# Patient Record
Sex: Female | Born: 1955
Health system: Southern US, Community
[De-identification: ages and names within clinical notes are randomized; demographics above are authoritative.]

## PROBLEM LIST (undated history)

## (undated) DIAGNOSIS — I10 Essential (primary) hypertension: Secondary | ICD-10-CM

## (undated) DIAGNOSIS — R112 Nausea with vomiting, unspecified: Secondary | ICD-10-CM

## (undated) DIAGNOSIS — K5792 Diverticulitis of intestine, part unspecified, without perforation or abscess without bleeding: Secondary | ICD-10-CM

## (undated) DIAGNOSIS — G4733 Obstructive sleep apnea (adult) (pediatric): Secondary | ICD-10-CM

## (undated) HISTORY — DX: Essential (primary) hypertension: I10

## (undated) HISTORY — DX: Diverticulitis of intestine, part unspecified, without perforation or abscess without bleeding: K57.92

## (undated) HISTORY — PX: ABDOMINAL HYSTERECTOMY: SHX81

## (undated) HISTORY — DX: Obstructive sleep apnea (adult) (pediatric): G47.33

## (undated) HISTORY — PX: SMALL INTESTINE SURGERY: SHX150

---

## 2004-04-24 ENCOUNTER — Ambulatory Visit: Payer: Self-pay | Admitting: Cardiology

## 2005-05-13 HISTORY — PX: COLONOSCOPY: SHX174

## 2005-12-06 ENCOUNTER — Ambulatory Visit: Payer: Self-pay | Admitting: Gastroenterology

## 2006-03-24 ENCOUNTER — Ambulatory Visit (HOSPITAL_COMMUNITY): Admission: RE | Admit: 2006-03-24 | Discharge: 2006-03-24 | Payer: Self-pay | Admitting: Gastroenterology

## 2006-03-24 ENCOUNTER — Ambulatory Visit: Payer: Self-pay | Admitting: Gastroenterology

## 2007-01-30 ENCOUNTER — Ambulatory Visit (HOSPITAL_COMMUNITY): Admission: RE | Admit: 2007-01-30 | Discharge: 2007-01-30 | Payer: Self-pay | Admitting: Internal Medicine

## 2007-10-02 ENCOUNTER — Ambulatory Visit (HOSPITAL_COMMUNITY): Admission: RE | Admit: 2007-10-02 | Discharge: 2007-10-02 | Payer: Self-pay | Admitting: *Deleted

## 2008-02-22 ENCOUNTER — Ambulatory Visit (HOSPITAL_COMMUNITY): Admission: RE | Admit: 2008-02-22 | Discharge: 2008-02-22 | Payer: Self-pay | Admitting: Family Medicine

## 2008-06-02 ENCOUNTER — Ambulatory Visit (HOSPITAL_COMMUNITY): Admission: RE | Admit: 2008-06-02 | Discharge: 2008-06-02 | Payer: Self-pay | Admitting: Family Medicine

## 2010-02-26 ENCOUNTER — Telehealth (INDEPENDENT_AMBULATORY_CARE_PROVIDER_SITE_OTHER): Payer: Self-pay

## 2010-05-28 ENCOUNTER — Ambulatory Visit (HOSPITAL_COMMUNITY)
Admission: RE | Admit: 2010-05-28 | Discharge: 2010-05-28 | Payer: Self-pay | Source: Home / Self Care | Attending: Internal Medicine | Admitting: Internal Medicine

## 2010-06-03 ENCOUNTER — Encounter: Payer: Self-pay | Admitting: Family Medicine

## 2010-06-03 ENCOUNTER — Encounter: Payer: Self-pay | Admitting: Internal Medicine

## 2010-06-12 NOTE — Progress Notes (Signed)
----   Converted from flag ---- ---- 02/26/2010 2:08 PM, West Bali MD wrote: Please call pt. I reviewed her CT report. The foreign bodies appear to be two staples. They are in her stool and should pass w/o any problems. However if she wants to be sure they are gone, then she needs a FLEX SIG not a colonoscopy. She can be done on THUR not FRI.  ---- 02/26/2010 11:33 AM, Cloria Spring LPN wrote: T/C from Melvern Sample (28-Jun-2055) requesting a colonoscopy this Friday. Her last one was 03/24/2006.  She said she had a resent CT at [facility] and it indicated she had metallic staples in the colon. I got the report  for you and it said it may represent inbsted foreign body, less likely post surgical. I am faxing CT to you at ENDO. She can be reached at work today at [phone]. ( home number [phone]) ------------------------------  Appended Document:  Informed pt of the above. She said she will see if they pass. Said she could not possibly be off on  Friday after being out last week. She will call if any problems.

## 2010-09-28 NOTE — Consult Note (Signed)
NAMETEOLA, FELIPE               ACCOUNT NO.:  [id number]   MEDICAL RECORD NO.:  [id number]          PATIENT TYPE:  AMB   LOCATION:                                FACILITY:  [facility]   PHYSICIAN:  Kassie Mends, M.D.      DATE OF BIRTH:  [birth year]   DATE OF CONSULTATION:  DATE OF DISCHARGE:                                   CONSULTATION   Dr. Doreen Beam  [address]  [place]  [place] [address]   Dear Dr. Sherril Croon:   I am seeing Lindsey Mitchell as a new patient consultation per your request.  I am  seeing her for constipation.   Lindsey Mitchell is a 55 year old female whose normal bowel pattern was 1-2 bowel  movements a week.  Approximately 4-5 years, she required dieter's tea to  have a bowel movement on a daily basis.  She has tried going without the  dieter's tea and will become miserable.  She denies difficulty pushing the  stools out.  She does not have the urge to defecate.  She denies any blood  in her stool, black stools, abdominal pain, weight loss, or dysphagia.  She  has heartburn and indigestion two times a month if the constipation gets  bad.  She states that she had labs drawn two months ago but does not know  that her thyroid has been checked.  She has had an episiotomy with her  child.  She denies any over-the-counter supplements or medication.  She has  tried fruit, water, milk of magnesia without improvement in her ability to  have a bowel movement.  Her appetite is good.   Her past medical history includes hypertension, gastroesophageal reflux  disease, dysfunctional uterine bleeding and an intestinal obstruction.  Her  past surgical history includes a hysterectomy 20 years ago, lysis of  adhesions, and resection of a piece of bowel, according to her,  approximately six years ago.  She is not allergic to any medicines.  She  takes Avapro and Premarin.She denies any family history of colon cancer or  colon polyps.  She denies any family history of thyroid disease.  She denies  tobacco or alcohol use.  She works as an Print production planner for a  [employer].  She has one child, age 57. Her review of systems reveals  colonoscopy years ago for an unknown reason, otherwise all systems are  negative.   PHYSICAL EXAMINATION:  VITAL SIGNS:  Weight 163.5 pounds, height 5 foot 4  inches.  Temperature 98.1, blood pressure 134/80, pulse 78.  GENERAL:  She is in no apparent distress.  Alert and oriented x4.  HEENT:  Normocephalic and atraumatic.  Pupils are equal, round and reactive to  light.  Mouth:  No oral lesions.  Posterior pharynx without erythema or  exudate.  NECK:  Full range of motion and no lymphadenopathy.  LUNGS:  Clear  to auscultation bilaterally.  CARDIOVASCULAR:  Regular rhythm.  No murmur.  Normal S1 and S2.  ABDOMEN:  Bowel sounds are present.  Soft, nontender,  nondistended.  No rebound or guarding.  No hepatosplenomegaly.  Slightly  obese.  EXTREMITIES:  Without clubbing, cyanosis or edema.  She has no focal  neurological deficits.   Lindsey Mitchell is a 55 year old female who has longstanding history of delayed  colonic transit.  Her current difficulty having a bowel movement is likely  related to a functional bowel disorder.  The differential diagnosis includes  hypothyroidism.  Thank you for allowing me to see Lindsey Mitchell in  consultation.  My recommendations follow.   I recommend obtaining a TSH today.  She will be scheduled for a colonoscopy  to evaluate her constipation as well as for average-risk colon cancer  screening.  She may follow up in three months.   Please feel free to contact me at [phone] with additional questions.      Kassie Mends, M.D.  Electronically Signed     SM/MEDQ  D:  12/06/2005  T:  12/06/2005  Job:  [id number]   cc:   Doreen Beam  Fax: [phone]

## 2010-09-28 NOTE — Op Note (Signed)
Lindsey Mitchell, Lindsey Mitchell               ACCOUNT NO.:  [id number]   MEDICAL RECORD NO.:  [id number]          PATIENT TYPE:  AMB   LOCATION:  DAY                           FACILITY:  [facility]   PHYSICIAN:  Kassie Mends, M.D.      DATE OF BIRTH:  1956/02/03   DATE OF PROCEDURE:  DATE OF DISCHARGE:                                 OPERATIVE REPORT   PROCEDURE PERFORMED:  Colonoscopy   INDICATIONS FOR PROCEDURE:  Ms. Brent is a 55 year old female who presents  for average risk colon cancer screening.   FINDINGS:  1. Rare right-sided diverticulosis.  2. Melanosis coli.  3. Otherwise normal colon without evidence of polyps, masses, inflammatory      changes or vascular ectasias.   RECOMMENDATIONS:  High fiber diet.  Hand-out given on high fiber diet and  diverticulosis.  Screening colonoscopy in ten years.   FOLLOWUP:  With Dr. Sherril Croon.   MEDICATIONS:  1. Demerol 100 mg IV.  2. Versed 6 mg IV.   PROCEDURE TECHNIQUE:  Physical exam was performed and informed consent  obtained from the patient after explaining the benefits, risks and  alternatives to the procedure.  The patient was connected to the monitor and  placed in the left lateral position.  Continuous oxygen was provided by  nasal cannula and IV meds administered via indwelling cannula.  After  administration of sedation and rectal exam the patient's rectum was  intubated and the  scope was advanced under direct visualization to the cecum.  The scope was  subsequently removed slowly, by carefully examing the color, texture,  anatomy and integrity of the mucosa on the way out. The patient was  recovered in the endoscopy suite and discharged home in satisfactory  condition.      Kassie Mends, M.D.  Electronically Signed     SM/MEDQ  D:  03/24/2006  T:  03/24/2006  Job:  [id number]   cc:   Doreen Beam  Fax: [phone]

## 2011-06-13 ENCOUNTER — Other Ambulatory Visit (HOSPITAL_COMMUNITY): Payer: Self-pay | Admitting: Internal Medicine

## 2011-06-13 DIAGNOSIS — Z139 Encounter for screening, unspecified: Secondary | ICD-10-CM

## 2011-06-14 ENCOUNTER — Ambulatory Visit (HOSPITAL_COMMUNITY)
Admission: RE | Admit: 2011-06-14 | Discharge: 2011-06-14 | Disposition: A | Discharge status: Home / Self Care | Payer: 59 | Source: Ambulatory Visit | Attending: Internal Medicine | Admitting: Internal Medicine

## 2011-06-14 DIAGNOSIS — Z139 Encounter for screening, unspecified: Secondary | ICD-10-CM

## 2011-06-14 DIAGNOSIS — Z1231 Encounter for screening mammogram for malignant neoplasm of breast: Secondary | ICD-10-CM | POA: Insufficient documentation

## 2013-03-11 ENCOUNTER — Other Ambulatory Visit (HOSPITAL_COMMUNITY): Payer: Self-pay | Admitting: Internal Medicine

## 2013-03-11 DIAGNOSIS — Z139 Encounter for screening, unspecified: Secondary | ICD-10-CM

## 2013-03-15 ENCOUNTER — Ambulatory Visit (HOSPITAL_COMMUNITY)
Admission: RE | Admit: 2013-03-15 | Discharge: 2013-03-15 | Disposition: A | Discharge status: Home / Self Care | Payer: 59 | Source: Ambulatory Visit | Attending: Internal Medicine | Admitting: Internal Medicine

## 2013-03-15 DIAGNOSIS — Z1231 Encounter for screening mammogram for malignant neoplasm of breast: Secondary | ICD-10-CM | POA: Insufficient documentation

## 2013-03-15 DIAGNOSIS — Z139 Encounter for screening, unspecified: Secondary | ICD-10-CM

## 2014-04-12 HISTORY — PX: COLONOSCOPY: SHX174

## 2014-05-10 ENCOUNTER — Other Ambulatory Visit (HOSPITAL_COMMUNITY): Payer: Self-pay | Admitting: Internal Medicine

## 2014-05-10 DIAGNOSIS — Z1231 Encounter for screening mammogram for malignant neoplasm of breast: Secondary | ICD-10-CM

## 2014-05-12 ENCOUNTER — Ambulatory Visit (HOSPITAL_COMMUNITY)
Admission: RE | Admit: 2014-05-12 | Discharge: 2014-05-12 | Disposition: A | Discharge status: Home / Self Care | Payer: 59 | Source: Ambulatory Visit | Attending: Internal Medicine | Admitting: Internal Medicine

## 2014-05-12 DIAGNOSIS — Z1231 Encounter for screening mammogram for malignant neoplasm of breast: Secondary | ICD-10-CM | POA: Insufficient documentation

## 2016-02-08 ENCOUNTER — Emergency Department (HOSPITAL_COMMUNITY)
Admission: EM | Admit: 2016-02-08 | Discharge: 2016-02-08 | Disposition: A | Discharge status: Home / Self Care | Payer: 59 | Attending: Emergency Medicine | Admitting: Emergency Medicine

## 2016-02-08 ENCOUNTER — Emergency Department (HOSPITAL_COMMUNITY): Payer: 59

## 2016-02-08 ENCOUNTER — Encounter (HOSPITAL_COMMUNITY): Payer: Self-pay | Admitting: Emergency Medicine

## 2016-02-08 DIAGNOSIS — Z79899 Other long term (current) drug therapy: Secondary | ICD-10-CM | POA: Insufficient documentation

## 2016-02-08 DIAGNOSIS — R1012 Left upper quadrant pain: Secondary | ICD-10-CM | POA: Diagnosis present

## 2016-02-08 DIAGNOSIS — R112 Nausea with vomiting, unspecified: Secondary | ICD-10-CM | POA: Diagnosis not present

## 2016-02-08 DIAGNOSIS — R1084 Generalized abdominal pain: Secondary | ICD-10-CM | POA: Insufficient documentation

## 2016-02-08 DIAGNOSIS — R109 Unspecified abdominal pain: Secondary | ICD-10-CM

## 2016-02-08 LAB — LIPASE, BLOOD: Lipase: 16 U/L (ref 11–51)

## 2016-02-08 LAB — COMPREHENSIVE METABOLIC PANEL
ALT: 43 U/L (ref 14–54)
ANION GAP: 10 (ref 5–15)
AST: 28 U/L (ref 15–41)
Albumin: 4.8 g/dL (ref 3.5–5.0)
Alkaline Phosphatase: 81 U/L (ref 38–126)
BUN: 19 mg/dL (ref 6–20)
CHLORIDE: 100 mmol/L — AB (ref 101–111)
CO2: 26 mmol/L (ref 22–32)
Calcium: 9.7 mg/dL (ref 8.9–10.3)
Creatinine, Ser: 0.73 mg/dL (ref 0.44–1.00)
GFR calc non Af Amer: >60 mL/min (ref >60)
Glucose, Bld: 117 mg/dL — ABNORMAL HIGH (ref 65–99)
POTASSIUM: 3.4 mmol/L — AB (ref 3.5–5.1)
SODIUM: 136 mmol/L (ref 135–145)
Total Bilirubin: 0.8 mg/dL (ref 0.3–1.2)
Total Protein: 7.3 g/dL (ref 6.5–8.1)

## 2016-02-08 LAB — CBC WITH DIFFERENTIAL/PLATELET
BASOS ABS: 0 10*3/uL (ref 0.0–0.1)
BASOS PCT: 0 %
Eosinophils Absolute: 0 10*3/uL (ref 0.0–0.7)
Eosinophils Relative: 0 %
HEMATOCRIT: 41.9 % (ref 36.0–46.0)
HEMOGLOBIN: 14.3 g/dL (ref 12.0–15.0)
Lymphocytes Relative: 12 %
Lymphs Abs: 1.1 10*3/uL (ref 0.7–4.0)
MCH: 31.1 pg (ref 26.0–34.0)
MCHC: 34.1 g/dL (ref 30.0–36.0)
MCV: 91.1 fL (ref 78.0–100.0)
Monocytes Absolute: 0.6 10*3/uL (ref 0.1–1.0)
Monocytes Relative: 7 %
NEUTROS ABS: 7.6 10*3/uL (ref 1.7–7.7)
NEUTROS PCT: 81 %
Platelets: 291 10*3/uL (ref 150–400)
RBC: 4.6 MIL/uL (ref 3.87–5.11)
RDW: 12.3 % (ref 11.5–15.5)
WBC: 9.3 10*3/uL (ref 4.0–10.5)

## 2016-02-08 LAB — URINALYSIS, ROUTINE W REFLEX MICROSCOPIC
Bilirubin Urine: NEGATIVE
Glucose, UA: NEGATIVE mg/dL
Hgb urine dipstick: NEGATIVE
Ketones, ur: NEGATIVE mg/dL
Leukocytes, UA: NEGATIVE
NITRITE: NEGATIVE
Protein, ur: NEGATIVE mg/dL
SPECIFIC GRAVITY, URINE: 1.01 (ref 1.005–1.030)
pH: 6.5 (ref 5.0–8.0)

## 2016-02-08 MED ORDER — IOPAMIDOL (ISOVUE-300) INJECTION 61%
INTRAVENOUS | Status: AC
  Administered 2016-02-08: 100 mL
  Filled 2016-02-08: qty 30

## 2016-02-08 MED ORDER — SODIUM CHLORIDE 0.9 % IV SOLN
Freq: Once | INTRAVENOUS | Status: AC
  Administered 2016-02-08: 18:00:00 via INTRAVENOUS

## 2016-02-08 MED ORDER — ONDANSETRON HCL 4 MG/2ML IJ SOLN
4.0000 mg | Freq: Once | INTRAMUSCULAR | Status: AC
  Administered 2016-02-08: 4 mg via INTRAVENOUS
  Filled 2016-02-08: qty 2

## 2016-02-08 MED ORDER — FENTANYL CITRATE (PF) 100 MCG/2ML IJ SOLN
50.0000 ug | Freq: Once | INTRAMUSCULAR | Status: AC
  Administered 2016-02-08: 50 ug via INTRAVENOUS
  Filled 2016-02-08: qty 2

## 2016-02-08 MED ORDER — MORPHINE SULFATE (PF) 4 MG/ML IV SOLN
2.0000 mg | Freq: Once | INTRAVENOUS | Status: AC
  Administered 2016-02-08: 2 mg via INTRAVENOUS

## 2016-02-08 MED ORDER — IOPAMIDOL (ISOVUE-300) INJECTION 61%: 100.0000 mL | Freq: Once | INTRAVENOUS | Status: DC | PRN

## 2016-02-08 MED ORDER — MORPHINE SULFATE (PF) 4 MG/ML IV SOLN
4.0000 mg | Freq: Once | INTRAVENOUS | Status: DC
  Filled 2016-02-08: qty 1

## 2016-02-08 MED ORDER — METRONIDAZOLE 500 MG PO TABS: 500.0000 mg | ORAL_TABLET | Freq: Two times a day (BID) | ORAL | 0 refills | Status: DC

## 2016-02-08 MED ORDER — CIPROFLOXACIN HCL 500 MG PO TABS: 500.0000 mg | ORAL_TABLET | Freq: Two times a day (BID) | ORAL | 0 refills | Status: DC

## 2016-02-08 MED ORDER — ONDANSETRON HCL 4 MG PO TABS: 4.0000 mg | ORAL_TABLET | Freq: Four times a day (QID) | ORAL | 0 refills | Status: DC

## 2016-02-08 NOTE — ED Provider Notes (Signed)
[facility] DEPT Provider Note   CSN: [id number] Arrival date & time: 02/08/16  1628     History   Chief Complaint Chief Complaint  Patient presents with  . Abdominal Pain    HPI Lindsey Mitchell is a 60 y.o. female.  Patient presents with diffuse abdominal pain with nausea and vomiting. States she woke around 2 AM with left upper quadrant abdominal pain and has since moved it radiated across her entire abdomen. One episode of vomiting at 5 AM 1 episode of vomiting at noon. States she's had a surgery in the past for a stomach obstruction". Also has had a hysterectomy. Denies any fevers. Denies any chest pain or shortness of breath. Denies any urinary or vaginal symptoms. Is still passing gas and had a normal bowel movement today.   The history is provided by the patient.  Abdominal Pain   Associated symptoms include nausea and vomiting. Pertinent negatives include fever, dysuria, hematuria, headaches, arthralgias and myalgias.    History reviewed. No pertinent past medical history.  There are no active problems to display for this patient.   Past Surgical History:  Procedure Laterality Date  . ABDOMINAL HYSTERECTOMY      OB History    Gravida Para Term Preterm AB Living   1 1 1          SAB TAB Ectopic Multiple Live Births                   Home Medications    Prior to Admission medications   Not on File    Family History Family History  Problem Relation Age of Onset  . Heart failure Mother   . Heart failure Father   . Hypertension Father   . Heart attack Sister   . Heart attack Brother   . Stroke Brother   . Hypertension Other     Social History Social History  Substance Use Topics  . Smoking status: Never Smoker  . Smokeless tobacco: Never Used  . Alcohol use Yes     Comment: socially     Allergies   Review of patient's allergies indicates no known allergies.   Review of Systems Review of Systems  Constitutional: Positive for activity  change and appetite change. Negative for fever.  HENT: Negative for congestion.   Respiratory: Negative for cough, chest tightness and shortness of breath.   Cardiovascular: Negative for chest pain.  Gastrointestinal: Positive for abdominal pain, nausea and vomiting.  Genitourinary: Negative for dysuria, hematuria, urgency, vaginal bleeding and vaginal discharge.  Musculoskeletal: Negative for arthralgias and myalgias.  Neurological: Negative for dizziness, light-headedness and headaches.     Physical Exam Updated Vital Signs BP 183/93   Pulse 90   Temp 98.1 F (36.7 C) (Oral)   Resp 18   Ht 5\' 4"  (1.626 m)   Wt 196 lb (88.9 kg)   SpO2 98%   BMI 33.64 kg/m   Physical Exam  Constitutional: She is oriented to person, place, and time. She appears well-developed and well-nourished. No distress.  HENT:  Head: Normocephalic and atraumatic.  Mouth/Throat: Oropharynx is clear and moist. No oropharyngeal exudate.  Eyes: Conjunctivae and EOM are normal. Pupils are equal, round, and reactive to light.  Neck: Normal range of motion. Neck supple.  No meningismus.  Cardiovascular: Normal rate, regular rhythm, normal heart sounds and intact distal pulses.   No murmur heard. Pulmonary/Chest: Effort normal and breath sounds normal. No respiratory distress.  Abdominal: Soft. There is tenderness. There is  no rebound and no guarding.  Diffuse abdominal tenderness, no guarding or rebound  Musculoskeletal: Normal range of motion. She exhibits no edema or tenderness.  No CVA tenderness  Neurological: She is alert and oriented to person, place, and time. No cranial nerve deficit. She exhibits normal muscle tone. Coordination normal.  No ataxia on finger to nose bilaterally. No pronator drift. 5/5 strength throughout. CN 2-12 intact.Equal grip strength. Sensation intact.   Skin: Skin is warm.  Psychiatric: She has a normal mood and affect. Her behavior is normal.  Nursing note and vitals  reviewed.    ED Treatments / Results  Labs (all labs ordered are listed, but only abnormal results are displayed) Labs Reviewed  COMPREHENSIVE METABOLIC PANEL - Abnormal; Notable for the following:       Result Value   Potassium 3.4 (*)    Chloride 100 (*)    Glucose, Bld 117 (*)    All other components within normal limits  CBC WITH DIFFERENTIAL/PLATELET  LIPASE, BLOOD  URINALYSIS, ROUTINE W REFLEX MICROSCOPIC (NOT AT [facility])    EKG  EKG Interpretation None       Radiology Ct Abdomen Pelvis W Contrast  Result Date: 02/08/2016 CLINICAL DATA:  Acute onset of generalized abdominal pain, worsening right. Vomiting. Initial encounter. 46 the PICC more EXAM: CT ABDOMEN AND PELVIS WITH CONTRAST TECHNIQUE: Multidetector CT imaging of the abdomen and pelvis was performed using the standard protocol following bolus administration of intravenous contrast. CONTRAST:  164mL ISOVUE-300 IOPAMIDOL (ISOVUE-300) INJECTION 61% COMPARISON:  None. FINDINGS: Lower chest: The visualized lung bases are grossly clear. The visualized portions of the mediastinum are unremarkable. Hepatobiliary: There is diffuse fatty infiltration within the liver. The gallbladder is grossly unremarkable in appearance. The common bile duct remains normal in caliber. Pancreas: The pancreas is within normal limits. Spleen: The spleen is unremarkable in appearance. Adrenals/Urinary Tract: The adrenal glands are unremarkable in appearance. The kidneys are within normal limits. There is no evidence of hydronephrosis. No renal or ureteral stones are identified. No perinephric stranding is seen. Stomach/Bowel: The stomach is unremarkable in appearance. There appears to be trace fluid and mild inflammation about ileal loops at the right lower quadrant, tracking minimally adjacent to the cecum, which may reflect a mild infectious or inflammatory ileitis. The appendix is not visualized; there is no evidence for appendicitis. The colon is  unremarkable in appearance. Vascular/Lymphatic: Scattered calcification is seen along the abdominal aorta and its branches. The abdominal aorta is otherwise grossly unremarkable. The inferior vena cava is grossly unremarkable. No retroperitoneal lymphadenopathy is seen. No pelvic sidewall lymphadenopathy is identified. Reproductive: The bladder is mildly distended and grossly unremarkable. The patient is status post hysterectomy. No suspicious adnexal masses are seen. Other: No additional soft tissue abnormalities are seen. Musculoskeletal: No significant soft tissue abnormalities are seen. IMPRESSION: 1. Trace fluid and mild inflammation about ileal loops at the right lower quadrant, tracking minimally adjacent to the cecum, which may reflect a mild infectious or inflammatory ileitis. 2. Diffuse fatty infiltration within the liver. 3. Scattered aortic atherosclerosis noted. Electronically Signed   By: Garald Balding M.D.   On: 02/08/2016 21:18   Dg Abdomen Acute W/chest  Result Date: 02/08/2016 CLINICAL DATA:  Mid to lower abdominal pain since this morning intermittently radiating to the lower back. History of small bowel obstruction or surgical repair and hysterectomy. EXAM: ACUTE ABDOMEN SERIES (2 VIEW ABDOMEN AND 1 VIEW CHEST) COMPARISON:  Chest radiograph comparison to 10/17/2015 and abdomen radiographs to 02/24/2011 FINDINGS:  The heart and mediastinal contours are normal. The lungs are clear. Interlocking chain artifact noted at along the right side of the neck. No acute osseous abnormality. In the right hemiabdomen is a moderately dilated small bowel loop out of proportion to nondistended, nonobstructed appearing large bowel. Findings may reflect early or partial SBO. Moderate amount of fecal retention in the ascending colon and cecum. No free air. No organomegaly. No pathologic calcifications. IMPRESSION: Abnormal dilated small bowel loop in the right hemi abdomen suspicious for early or partial SBO.  These results were called by telephone at the time of interpretation on 02/08/2016 at 6:14 pm to Dr. Ezequiel Essex , who verbally acknowledged these results. Electronically Signed   By: Ashley Royalty M.D.   On: 02/08/2016 18:15    Procedures Procedures (including critical care time)  Medications Ordered in ED Medications  ondansetron (ZOFRAN) injection 4 mg (not administered)  morphine 4 MG/ML injection 4 mg (not administered)  0.9 %  sodium chloride infusion (not administered)     Initial Impression / Assessment and Plan / ED Course  I have reviewed the triage vital signs and the nursing notes.  Pertinent labs & imaging results that were available during my care of the patient were reviewed by me and considered in my medical decision making (see chart for details).  Clinical Course  Diffuse abdominal pain with nausea and vomiting. Concern for possible bowel obstruction. We'll obtain labs and x-ray.  AAS as above. Labs and UA reassuring.  CT scan reviewed with Dr. Radene Knee. There is no evidence of bowel obstruction. There is some ileitis.  Will treat with antibiotics.  Patient tolerating PO in the ED and pain has improved.  Followup with PCP, return precautions discussed.  Final Clinical Impressions(s) / ED Diagnoses   Final diagnoses:  Abdominal pain, unspecified abdominal location    New Prescriptions New Prescriptions   No medications on file     Ezequiel Essex, MD 02/09/16 0150

## 2016-02-08 NOTE — ED Triage Notes (Signed)
Pt reports abdominal pain that began this am and has become much worse. Pt states the pain started in the L mid abdomen but now radiates all the way across. Emesis x 2. Pt has prior hx of bowel obstruction.

## 2016-02-08 NOTE — Discharge Instructions (Signed)
Take the antibiotics as prescribed and followup with your doctor. Return to the ED if you develop new or worsening symptoms. °

## 2016-02-08 NOTE — ED Notes (Signed)
Patient returned from CT at this time. States pain is subsiding with the help of pain medication.

## 2016-02-08 NOTE — ED Notes (Signed)
Pt given ginger ale.

## 2016-02-21 ENCOUNTER — Telehealth: Payer: Self-pay | Admitting: Gastroenterology

## 2016-02-21 NOTE — Telephone Encounter (Signed)
RECALL FOR TCS °

## 2016-02-22 NOTE — Telephone Encounter (Signed)
Letter mailed

## 2016-07-01 ENCOUNTER — Other Ambulatory Visit (HOSPITAL_COMMUNITY): Payer: Self-pay | Admitting: Internal Medicine

## 2016-07-01 DIAGNOSIS — Z1231 Encounter for screening mammogram for malignant neoplasm of breast: Secondary | ICD-10-CM

## 2016-07-08 ENCOUNTER — Ambulatory Visit (HOSPITAL_COMMUNITY)
Admission: RE | Admit: 2016-07-08 | Discharge: 2016-07-08 | Disposition: A | Discharge status: Home / Self Care | Payer: 59 | Source: Ambulatory Visit | Attending: Internal Medicine | Admitting: Internal Medicine

## 2016-07-08 DIAGNOSIS — Z1231 Encounter for screening mammogram for malignant neoplasm of breast: Secondary | ICD-10-CM | POA: Diagnosis not present

## 2017-01-03 ENCOUNTER — Inpatient Hospital Stay (HOSPITAL_COMMUNITY): Admission: RE | Admit: 2017-01-03 | Payer: 59 | Source: Ambulatory Visit

## 2017-01-10 ENCOUNTER — Encounter (HOSPITAL_COMMUNITY): Admission: RE | Payer: Self-pay | Source: Ambulatory Visit

## 2017-01-10 ENCOUNTER — Ambulatory Visit (HOSPITAL_COMMUNITY): Admission: RE | Admit: 2017-01-10 | Payer: 59 | Source: Ambulatory Visit | Admitting: Ophthalmology

## 2017-01-10 SURGERY — PHACOEMULSIFICATION, CATARACT, WITH IOL INSERTION
Anesthesia: Monitor Anesthesia Care | Site: Eye | Laterality: Left

## 2017-09-23 IMAGING — DX DG ABDOMEN ACUTE W/ 1V CHEST
4 series · 4 of 4 positions shown · non-contrast
Comparison: Chest radiograph comparison to 10/17/2015 and abdomen
radiographs to 02/24/2011

CLINICAL DATA: Mid to lower abdominal pain since this morning
intermittently radiating to the lower back. History of small bowel
obstruction or surgical repair and hysterectomy.

EXAM:
ACUTE ABDOMEN SERIES (2 VIEW ABDOMEN AND 1 VIEW CHEST)

[chest pa]
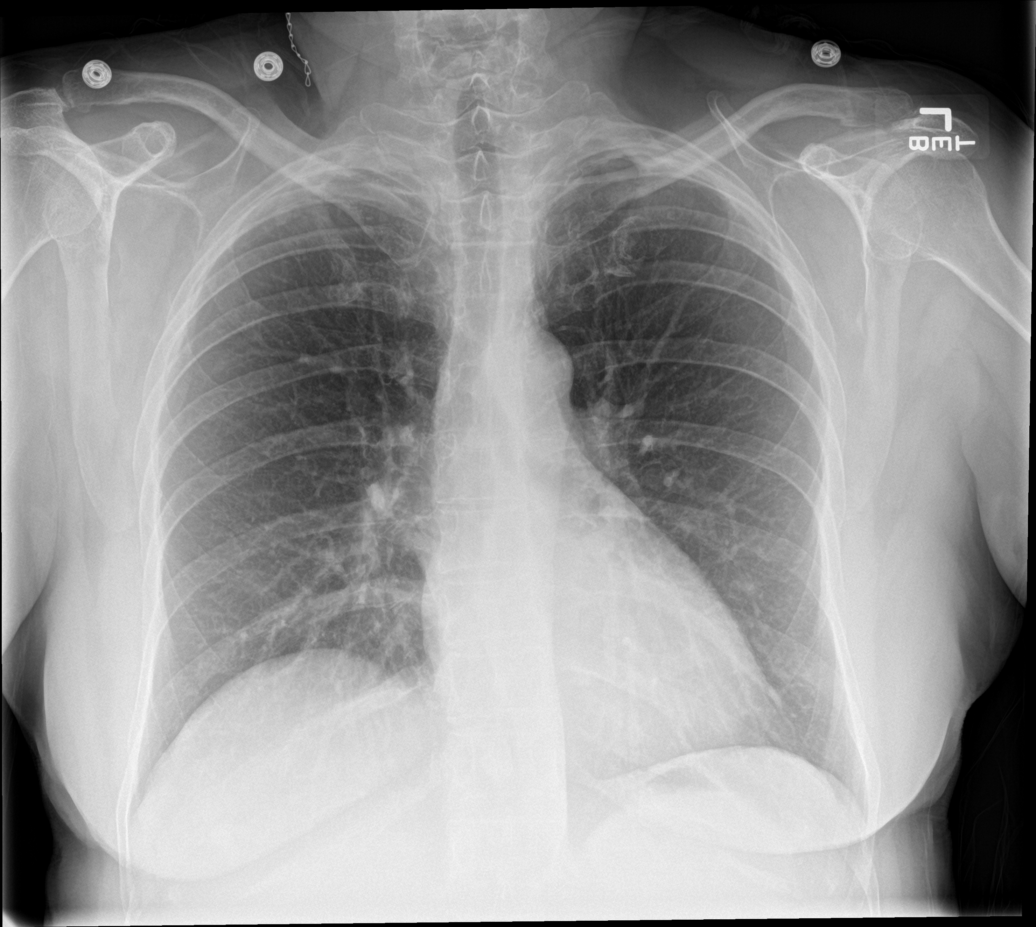

[abdomen erect]
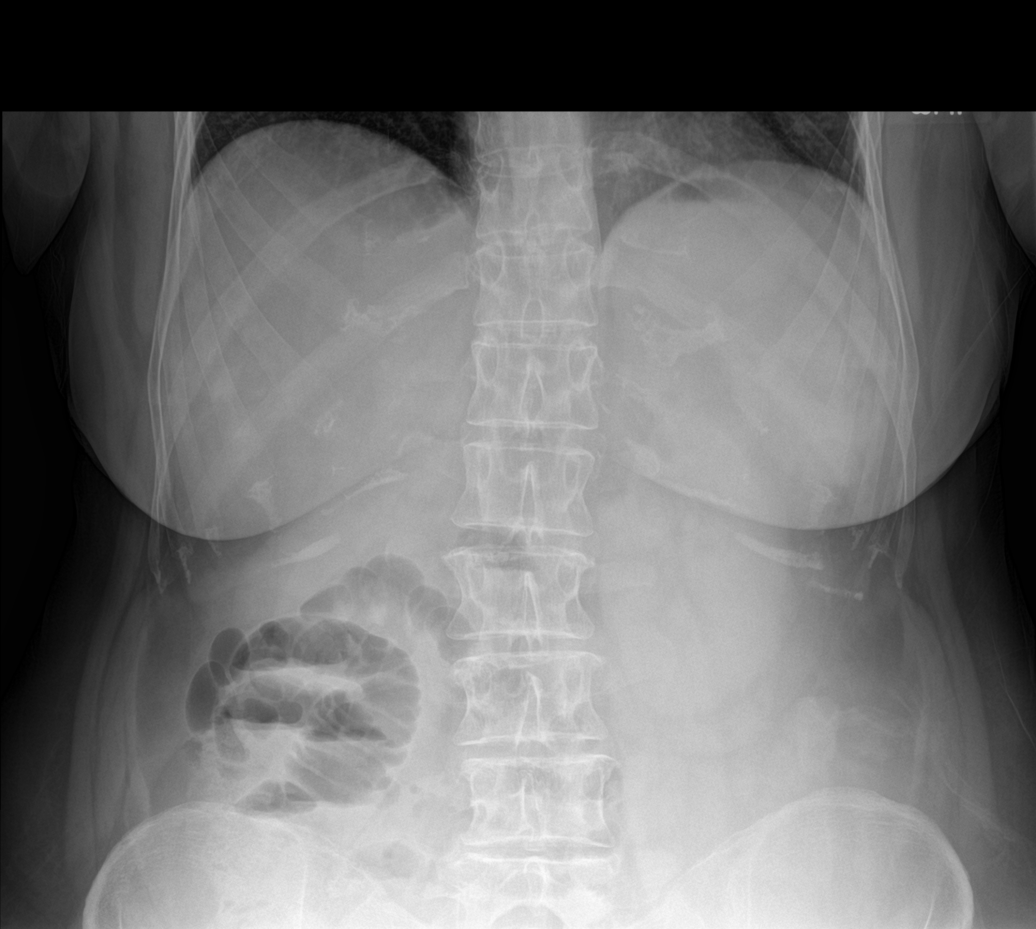

[abdomen supine (1 of 2)]
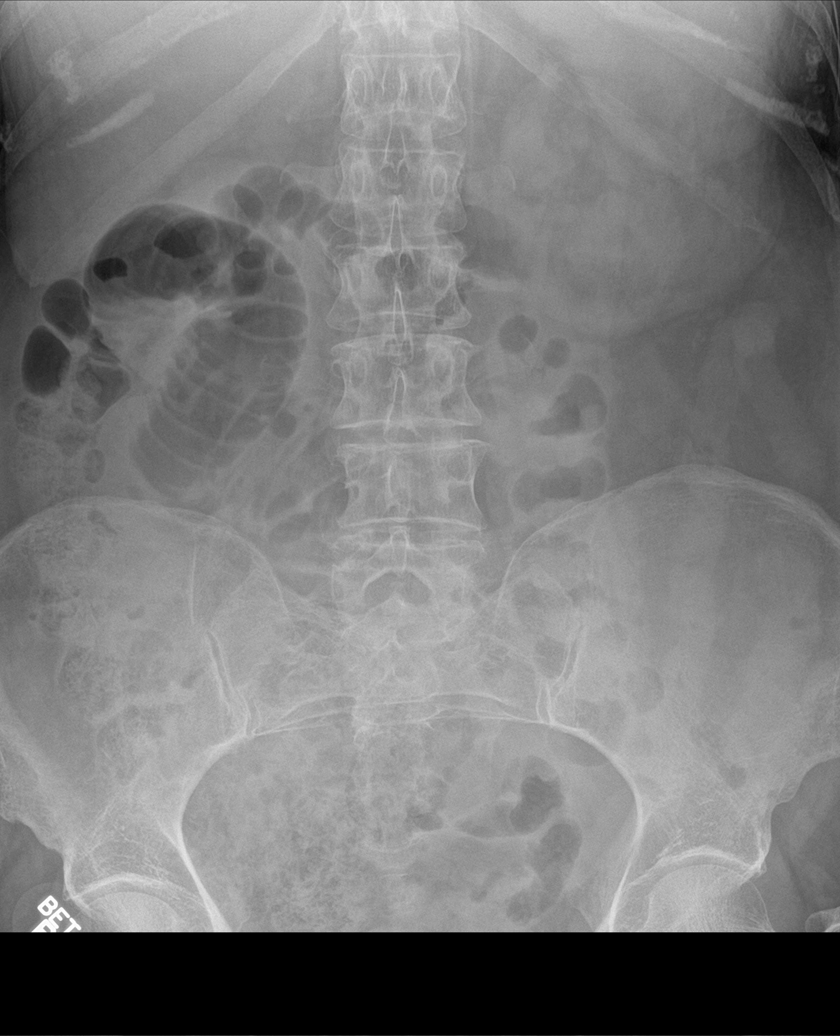

[abdomen supine (2 of 2)]
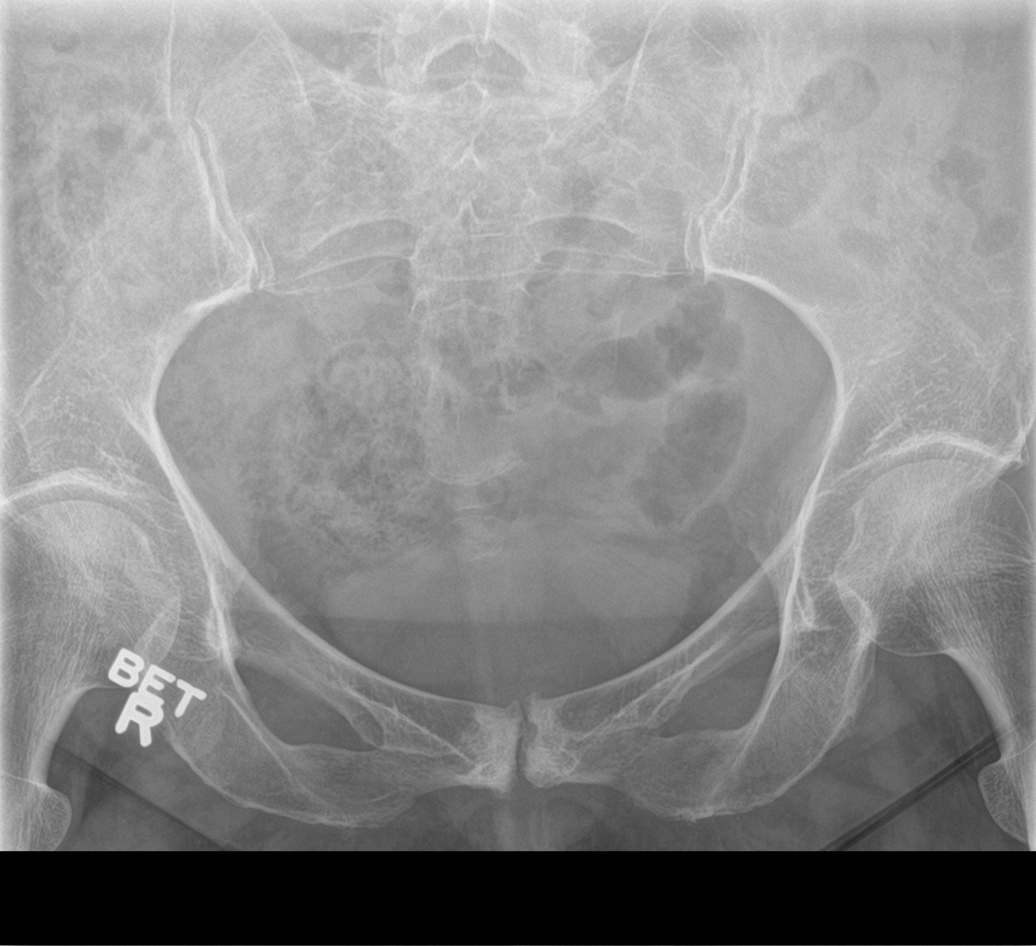

[4 of 4 positions shown; findings below may reference images not displayed]

FINDINGS: The heart and mediastinal contours are normal. The lungs are clear.
Interlocking chain artifact noted at along the right side of the
neck. No acute osseous abnormality.

In the right hemiabdomen is a moderately dilated small bowel loop
out of proportion to nondistended, nonobstructed appearing large
bowel. Findings may reflect early or partial SBO. Moderate amount of
fecal retention in the ascending colon and cecum. No free air. No
organomegaly. No pathologic calcifications.
IMPRESSION: Abnormal dilated small bowel loop in the right hemi abdomen
suspicious for early or partial SBO.

These results were called by telephone at the time of interpretation
on 02/08/2016 at [DATE] to Dr. SANIA JEREZ , who verbally
acknowledged these results.

## 2017-10-01 ENCOUNTER — Other Ambulatory Visit (HOSPITAL_COMMUNITY): Payer: Self-pay | Admitting: Internal Medicine

## 2017-10-01 DIAGNOSIS — Z1231 Encounter for screening mammogram for malignant neoplasm of breast: Secondary | ICD-10-CM

## 2017-10-03 ENCOUNTER — Ambulatory Visit (HOSPITAL_COMMUNITY): Payer: 59

## 2017-11-10 ENCOUNTER — Ambulatory Visit (HOSPITAL_COMMUNITY)
Admission: RE | Admit: 2017-11-10 | Discharge: 2017-11-10 | Disposition: A | Discharge status: Home / Self Care | Payer: 59 | Source: Ambulatory Visit | Attending: Internal Medicine | Admitting: Internal Medicine

## 2017-11-10 DIAGNOSIS — Z1231 Encounter for screening mammogram for malignant neoplasm of breast: Secondary | ICD-10-CM

## 2018-02-19 ENCOUNTER — Ambulatory Visit (INDEPENDENT_AMBULATORY_CARE_PROVIDER_SITE_OTHER): Payer: 59 | Admitting: Otolaryngology

## 2018-02-19 DIAGNOSIS — D37030 Neoplasm of uncertain behavior of the parotid salivary glands: Secondary | ICD-10-CM | POA: Diagnosis not present

## 2018-02-19 DIAGNOSIS — R42 Dizziness and giddiness: Secondary | ICD-10-CM | POA: Diagnosis not present

## 2018-07-13 ENCOUNTER — Encounter: Payer: Self-pay | Admitting: Gastroenterology

## 2018-09-11 ENCOUNTER — Ambulatory Visit: Payer: 59 | Admitting: Gastroenterology

## 2018-11-19 ENCOUNTER — Telehealth: Payer: Self-pay | Admitting: Gastroenterology

## 2018-11-19 ENCOUNTER — Other Ambulatory Visit: Payer: Self-pay

## 2018-11-19 ENCOUNTER — Encounter: Payer: Self-pay | Admitting: Gastroenterology

## 2018-11-19 ENCOUNTER — Ambulatory Visit (INDEPENDENT_AMBULATORY_CARE_PROVIDER_SITE_OTHER): Payer: 59 | Admitting: Gastroenterology

## 2018-11-19 DIAGNOSIS — K760 Fatty (change of) liver, not elsewhere classified: Secondary | ICD-10-CM | POA: Diagnosis not present

## 2018-11-19 DIAGNOSIS — R140 Abdominal distension (gaseous): Secondary | ICD-10-CM | POA: Insufficient documentation

## 2018-11-19 DIAGNOSIS — R945 Abnormal results of liver function studies: Secondary | ICD-10-CM

## 2018-11-19 DIAGNOSIS — R7989 Other specified abnormal findings of blood chemistry: Secondary | ICD-10-CM

## 2018-11-19 NOTE — Progress Notes (Signed)
Primary Care Physician:  Berenice Primas  Primary Gastroenterologist:  Barney Drain, MD   Chief Complaint  Patient presents with  . FATTY LIVER  . ABNORMAL LFTS    HPI:  Lindsey Mitchell is a 63 y.o. female here at the request of Nicanor Bake for further evaluation of fatty liver and abnormal LFTs.   We saw patient remotely. She had a colonoscopy in 2007 with rare right-sided diverticulosis, melanosis coli.  Recommended 10-year screening colonoscopy. She reports colonoscopy in [place] in 2015, normal except for diverticulosis.   She states she was just told about abnormal LFTs and fatty liver at her physical at beginning of the year. Initially labs showed platelets 274,000, LDL 123, HDL 59, albumin 4.7, total bilirubin 0.4, alkaline phosphatase 83, AST 46, ALT 73, hepatitis A IgM negative, hepatitis B surface antigen negative, hepatitis B core IgM negative, hepatitis C antibody negative. U/S showed right hepatic lobe measures 16.4 cm in dimension.  Diffuse fatty infiltration of the liver.  Otherwise unremarkable abdominal ultrasound.  Spleen reported to be normal.  Since then she has worked hard and has lost 20 pounds. She is exercising regularly. She is watching her diet to try and control her cholesterol as well.    Review of Epic: CT abdomen pelvis with contrast back in September 2017 showed fatty liver.  CT abdomen pelvis with contrast in February 2019 with marked hepatic steatosis.  Spleen normal.  Patient takes TUMS if bad heartburn. No dysphagia. No abdominal pain. She complains of a lot of bloating, daily. Has been happening chronically. BM regular with Colon Clenz. She has h/o diverticulitis but no recent episodes since she has improved her diet. No melena, brbpr.    Current Outpatient Medications  Medication Sig Dispense Refill  . candesartan-hydrochlorothiazide (ATACAND HCT) 16-12.5 MG tablet Take 1 tablet by mouth daily.    . cetirizine (ZYRTEC) 10 MG tablet Take 10 mg by  mouth daily.    . Misc Natural Products (COLON CLEANSE PO) Take by mouth 2 (two) times daily as needed.    . ondansetron (ZOFRAN) 4 MG tablet Take 1 tablet (4 mg total) by mouth every 6 (six) hours. 12 tablet 0  . valACYclovir (VALTREX) 500 MG tablet Take 500 mg by mouth daily as needed.     No current facility-administered medications for this visit.     Allergies as of 11/19/2018 - Review Complete 11/19/2018  Allergen Reaction Noted  . Relafen [nabumetone] Swelling 11/19/2018  . Losartan Rash 11/19/2018    Past Medical History:  Diagnosis Date  . Hypertension     Past Surgical History:  Procedure Laterality Date  . ABDOMINAL HYSTERECTOMY    . COLONOSCOPY  2015   diverticulosis  . COLONOSCOPY  2007   Dr. Oneida Alar: diverticulosis  . SMALL INTESTINE SURGERY     bowel obstruction    Family History  Problem Relation Age of Onset  . Heart failure Mother   . Heart failure Father   . Hypertension Father   . Heart attack Sister   . Heart attack Brother   . Stroke Brother   . Hypertension Other   . Liver disease Neg Hx   . Colon cancer Neg Hx     Social History   Socioeconomic History  . Marital status: Married    Spouse name: Not on file  . Number of children: Not on file  . Years of education: Not on file  . Highest education level: Not on file  Occupational History  .  Not on file  Social Needs  . Financial resource strain: Not on file  . Food insecurity    Worry: Not on file    Inability: Not on file  . Transportation needs    Medical: Not on file    Non-medical: Not on file  Tobacco Use  . Smoking status: Never Smoker  . Smokeless tobacco: Never Used  Substance and Sexual Activity  . Alcohol use: Yes    Comment: socially  . Drug use: No  . Sexual activity: Not on file  Lifestyle  . Physical activity    Days per week: Not on file    Minutes per session: Not on file  . Stress: Not on file  Relationships  . Social Herbalist on phone: Not  on file    Gets together: Not on file    Attends religious service: Not on file    Active member of club or organization: Not on file    Attends meetings of clubs or organizations: Not on file    Relationship status: Not on file  . Intimate partner violence    Fear of current or ex partner: Not on file    Emotionally abused: Not on file    Physically abused: Not on file    Forced sexual activity: Not on file  Other Topics Concern  . Not on file  Social History Narrative  . Not on file      ROS:  General: Negative for anorexia, weight loss, fever, chills, fatigue, weakness. Eyes: Negative for vision changes.  ENT: Negative for hoarseness, difficulty swallowing , nasal congestion. CV: Negative for chest pain, angina, palpitations, dyspnea on exertion, peripheral edema.  Respiratory: Negative for dyspnea at rest, dyspnea on exertion, cough, sputum, wheezing.  GI: See history of present illness. GU:  Negative for dysuria, hematuria, urinary incontinence, urinary frequency, nocturnal urination.  MS: Negative for joint pain, low back pain.  Derm: Negative for rash or itching.  Neuro: Negative for weakness, abnormal sensation, seizure, frequent headaches, memory loss, confusion.  Psych: Negative for anxiety, depression, suicidal ideation, hallucinations.  Endo: Negative for unusual weight change.  Heme: Negative for bruising or bleeding. Allergy: Negative for rash or hives.    Physical Examination:  BP 134/78   Pulse 83   Temp 98.3 F (36.8 C) (Oral)   Ht 5\' 4"  (1.626 m)   Wt 187 lb 6.4 oz (85 kg)   BMI 32.17 kg/m    General: Well-nourished, well-developed in no acute distress.  Head: Normocephalic, atraumatic.   Eyes: Conjunctiva pink, no icterus. Mouth: Oropharyngeal mucosa moist and pink , no lesions erythema or exudate. Neck: Supple without thyromegaly, masses, or lymphadenopathy.  Lungs: Clear to auscultation bilaterally.  Heart: Regular rate and rhythm, no murmurs  rubs or gallops.  Abdomen: Bowel sounds are normal, nontender, nondistended, no hepatosplenomegaly or masses, no abdominal bruits or    hernia , no rebound or guarding.   Rectal: not performed Extremities: No lower extremity edema. No clubbing or deformities.  Neuro: Alert and oriented x 4 , grossly normal neurologically.  Skin: Warm and dry, no rash or jaundice.   Psych: Alert and cooperative, normal mood and affect.  Labs: As above.  White blood cell count 3800, hemoglobin 13.4, hematocrit 40, platelets 274,000, BUN 13, creatinine 0.76, glucose 104, sodium 139, potassium 4.2, TSH 2.130  Imaging Studies: No results found.

## 2018-11-19 NOTE — Telephone Encounter (Signed)
Patient seen in office yesterday and provided lab orders to take to PCP when she goes for labs next week. We also need to get fasting lipid panel. Can we get those orders to patient or let PCP know when are wanting these labs (lipid panel and others ordered yesterday) done with her next blood draw coming up.

## 2018-11-19 NOTE — Assessment & Plan Note (Signed)
Chronic postprandial bloating. Constipation well treated. Screen for celiac. Recommend follow low FODMAP diet. Return to office in six months.

## 2018-11-19 NOTE — Assessment & Plan Note (Addendum)
Very pleasant 63 y/o female presents for further evaluation of elevated transaminases and fatty liver on imaging. Viral markers negative. Suspect NASH but need to exclude hemochromatosis. Right hepatic lobe length 16.4cm. Will check immune status to Hep A and Hep B. Update LFTS. Patient has made significant lifestyle changes including 20 pound weight loss, daily exercise. Will see what updated LFTs look like.   Consider updating cholesterol levels to see if responded to diet/exercise. Would consider medication management of elevated cholesterol if persistent due to benefits in management of fatty liver.   Encouraged additional weight loss. Return to office in six months.

## 2018-11-19 NOTE — Patient Instructions (Addendum)
1. Please take our lab orders with you when you see your PCP again. We will call you with results so if you have not heard from [place] in a couple of days, please call to make sure we received the lab results.  2. We will check you for celiac disease, a condition of gluten sensitivity which can be linked with liver disease and digestive issues.  3. Try to limit foods on the FODMAP diet from the HIGH column as these can cause increased bloating/gas.  4. Continue diet and exercise. Try to lose additional 10 pounds in the next 4-6 months.  5. Return to the office in six months to see Dr. Oneida Alar.    Fatty Liver Disease  Fatty liver disease occurs when too much fat has built up in your liver cells. Fatty liver disease is also called hepatic steatosis or steatohepatitis. The liver removes harmful substances from your bloodstream and produces fluids that your body needs. It also helps your body use and store energy from the food you eat. In many cases, fatty liver disease does not cause symptoms or problems. It is often diagnosed when tests are being done for other reasons. However, over time, fatty liver can cause inflammation that may lead to more serious liver problems, such as scarring of the liver (cirrhosis) and liver failure. Fatty liver is associated with insulin resistance, increased body fat, high blood pressure (hypertension), and high cholesterol. These are features of metabolic syndrome and increase your risk for stroke, diabetes, and heart disease. What are the causes? This condition may be caused by:  Drinking too much alcohol.  Poor nutrition.  Obesity.  Cushing's syndrome.  Diabetes.  High cholesterol.  Certain drugs.  Poisons.  Some viral infections.  Pregnancy. What increases the risk? You are more likely to develop this condition if you:  Abuse alcohol.  Are overweight.  Have diabetes.  Have hepatitis.  Have a high triglyceride level.  Are pregnant. What are  the signs or symptoms? Fatty liver disease often does not cause symptoms. If symptoms do develop, they can include:  Fatigue.  Weakness.  Weight loss.  Confusion.  Abdominal pain.  Nausea and vomiting.  Yellowing of your skin and the white parts of your eyes (jaundice).  Itchy skin. How is this diagnosed? This condition may be diagnosed by:  A physical exam and medical history.  Blood tests.  Imaging tests, such as an ultrasound, CT scan, or MRI.  A liver biopsy. A small sample of liver tissue is removed using a needle. The sample is then looked at under a microscope. How is this treated? Fatty liver disease is often caused by other health conditions. Treatment for fatty liver may involve medicines and lifestyle changes to manage conditions such as:  Alcoholism.  High cholesterol.  Diabetes.  Being overweight or obese. Follow these instructions at home:   Do not drink alcohol. If you have trouble quitting, ask your health care provider how to safely quit with the help of medicine or a supervised program. This is important to keep your condition from getting worse.  Eat a healthy diet as told by your health care provider. Ask your health care provider about working with a diet and nutrition specialist (dietitian) to develop an eating plan.  Exercise regularly. This can help you lose weight and control your cholesterol and diabetes. Talk to your health care provider about an exercise plan and which activities are best for you.  Take over-the-counter and prescription medicines only as  told by your health care provider.  Keep all follow-up visits as told by your health care provider. This is important. Contact a health care provider if: You have trouble controlling your:  Blood sugar. This is especially important if you have diabetes.  Cholesterol.  Drinking of alcohol. Get help right away if:  You have abdominal pain.  You have jaundice.  You have nausea  and vomiting.  You vomit blood or material that looks like coffee grounds.  You have stools that are black, tar-like, or bloody. Summary  Fatty liver disease develops when too much fat builds up in the cells of your liver.  Fatty liver disease often causes no symptoms or problems. However, over time, fatty liver can cause inflammation that may lead to more serious liver problems, such as scarring of the liver (cirrhosis).  You are more likely to develop this condition if you abuse alcohol, are pregnant, are overweight, have diabetes, have hepatitis, or have high triglyceride levels.  Contact your health care provider if you have trouble controlling your weight, blood sugar, cholesterol, or drinking of alcohol. This information is not intended to replace advice given to you by your health care provider. Make sure you discuss any questions you have with your health care provider. Document Released: 06/14/2005 Document Revised: 04/11/2017 Document Reviewed: 02/05/2017 Elsevier Patient Education  2020 [employer].   Nonalcoholic Fatty Liver Disease Diet, Adult Nonalcoholic fatty liver disease is a condition that causes fat to build up in and around the liver. The disease makes it harder for the liver to work the way that it should. Following a healthy diet can help to keep nonalcoholic fatty liver disease under control. It can also help to prevent or improve conditions that are associated with the disease, such as heart disease, diabetes, high blood pressure, and abnormal cholesterol levels. Along with regular exercise, this diet:  Promotes weight loss.  Helps to control blood sugar levels.  Helps to improve the way that the body uses insulin. What are tips for following this plan? Reading food labels Always check food labels for:  The amount of saturated fat in a food. You should limit your intake of saturated fat. Saturated fat is found in foods that come from animals, including meat  and dairy products such as butter, cheese, and whole milk.  The amount of fiber in a food. You should choose high-fiber foods such as fruits, vegetables, and whole grains. Try to get 25-30 grams (g) of fiber a day.  Cooking  When cooking, use heart-healthy oils that are high in monounsaturated fats. These include olive oil, canola oil, and avocado oil.  Limit frying or deep-frying foods. Cook foods using healthy methods such as baking, boiling, steaming, and grilling instead. Meal planning  You may want to keep track of how many calories you take in. Eating the right amount of calories will help you achieve a healthy weight. Meeting with a registered dietitian can help you get started.  Limit how often you eat takeout and fast food. These foods are usually very high in fat, salt, and sugar.  Use the glycemic index (GI) to plan your meals. The index tells you how quickly a food will raise your blood sugar. Choose low-GI foods (GI less than 55). These foods take a longer time to raise blood sugar. A registered dietitian can help you identify foods lower on the GI scale. Lifestyle  You may want to follow a Mediterranean diet. This diet includes a lot of vegetables,  lean meats or fish, whole grains, fruits, and healthy oils and fats. What foods can I eat?  Fruits Bananas. Apples. Oranges. Grapes. Papaya. Mango. Pomegranate. Kiwi. Grapefruit. Cherries. Vegetables Lettuce. Spinach. Peas. Beets. Cauliflower. Cabbage. Broccoli. Carrots. Tomatoes. Squash. Eggplant. Herbs. Peppers. Onions. Cucumbers. Brussels sprouts. Yams and sweet potatoes. Beans. Lentils. Grains Whole wheat or whole-grain foods, including breads, crackers, cereals, and pasta. Stone-ground whole wheat. Unsweetened oatmeal. Bulgur. Barley. Quinoa. Brown or wild rice. Corn or whole wheat flour tortillas. Meats and other proteins Lean meats. Poultry. Tofu. Seafood and shellfish. Dairy Low-fat or fat-free dairy products, such as  yogurt, cottage cheese, or cheese. Beverages Water. Sugar-free drinks. Tea. Coffee. Low-fat or skim milk. Milk alternatives, such as soy or almond milk. Real fruit juice. Fats and oils Avocado. Canola or olive oil. Nuts and nut butters. Seeds. Seasonings and condiments Mustard. Relish. Low-fat, low-sugar ketchup and barbecue sauce. Low-fat or fat-free mayonnaise. Sweets and desserts Sugar-free sweets. The items listed above may not be a complete list of foods and beverages you can eat. Contact a dietitian for more information. What foods should I limit or avoid? Meats and other proteins Limit red meat to 1-2 times a week. Dairy [employer]. Fats and oils Palm oil and coconut oil. Fried foods. Other foods Processed foods. Foods that contain a lot of salt or sodium. Sweets and desserts Sweets that contain sugar. Beverages Sweetened drinks, such as sweet tea, milkshakes, iced sweet drinks, and sodas. Alcohol. The items listed above may not be a complete list of foods and beverages you should avoid. Contact a dietitian for more information. Where to find more information The [employer] of Diabetes and Digestive and Kidney Diseases: [employer] Summary  Nonalcoholic fatty liver disease is a condition that causes fat to build up in and around the liver.  Following a healthy diet can help to keep nonalcoholic fatty liver disease under control. Your diet should be rich in fruits, vegetables, whole grains, and lean proteins.  Limit your intake of saturated fat. Saturated fat is found in foods that come from animals, including meat and dairy products such as butter, cheese, and whole milk.  This diet promotes weight loss, helps to control blood sugar levels, and helps to improve the way that the body uses insulin. This information is not intended to replace advice given to you by your health care provider. Make sure you discuss any questions you have with your health care  provider. Document Released: 09/13/2014 Document Revised: 08/21/2018 Document Reviewed: 05/21/2018 Elsevier Patient Education  2020 [employer].

## 2018-11-20 ENCOUNTER — Other Ambulatory Visit: Payer: Self-pay

## 2018-11-20 DIAGNOSIS — K760 Fatty (change of) liver, not elsewhere classified: Secondary | ICD-10-CM

## 2018-11-20 DIAGNOSIS — R7989 Other specified abnormal findings of blood chemistry: Secondary | ICD-10-CM

## 2018-11-20 DIAGNOSIS — R945 Abnormal results of liver function studies: Secondary | ICD-10-CM

## 2018-11-20 NOTE — Telephone Encounter (Signed)
Lab order at front for pick up per pt request.

## 2018-11-23 NOTE — Progress Notes (Signed)
cc'd to pcp 

## 2018-11-25 ENCOUNTER — Telehealth: Payer: Self-pay | Admitting: Gastroenterology

## 2018-11-25 NOTE — Telephone Encounter (Signed)
Left the full message on Vm for pt.

## 2018-11-25 NOTE — Telephone Encounter (Signed)
Leslie, please advise! 

## 2018-11-25 NOTE — Telephone Encounter (Signed)
Since her previous labs were at beginning of year we should go ahead with labs now. She can take my orders to [facility] and have done.

## 2018-11-25 NOTE — Telephone Encounter (Signed)
Pt was seen by [facility]SL recently and labs were ordered. Pt has a physical with her PCP in December and wanted to know if she could wait or go ahead and do the labs now. Please call [phone]

## 2018-11-27 ENCOUNTER — Telehealth: Payer: Self-pay | Admitting: Gastroenterology

## 2018-11-27 NOTE — Telephone Encounter (Signed)
Labs were printed by Tretha Sciara and given to pt by Erline Levine.

## 2018-11-27 NOTE — Telephone Encounter (Signed)
Patient called and said that she lost the other sheet that she was given at her prior appointment for tests she was to have done, can you please print those out so she can pick them up when she comes in to pick up her lab order that is in the drawer?

## 2018-12-05 LAB — HEPATIC FUNCTION PANEL
ALT: 24 IU/L (ref 0–32)
AST: 18 IU/L (ref 0–40)
Albumin: 4.7 g/dL (ref 3.8–4.8)
Alkaline Phosphatase: 99 IU/L (ref 39–117)
Bilirubin Total: 0.4 mg/dL (ref 0.0–1.2)
Bilirubin, Direct: 0.13 mg/dL (ref 0.00–0.40)
Total Protein: 6.6 g/dL (ref 6.0–8.5)

## 2018-12-05 LAB — LIPID PANEL
Chol/HDL Ratio: 2.7 ratio (ref 0.0–4.4)
Cholesterol, Total: 223 mg/dL — ABNORMAL HIGH (ref 100–199)
HDL: 82 mg/dL (ref >39)
LDL Calculated: 127 mg/dL — ABNORMAL HIGH (ref 0–99)
Triglycerides: 71 mg/dL (ref 0–149)
VLDL Cholesterol Cal: 14 mg/dL (ref 5–40)

## 2018-12-05 LAB — IRON,TIBC AND FERRITIN PANEL
Ferritin: 116 ng/mL (ref 15–150)
Iron Saturation: 35 % (ref 15–55)
Iron: 115 ug/dL (ref 27–139)
Total Iron Binding Capacity: 328 ug/dL (ref 250–450)
UIBC: 213 ug/dL (ref 118–369)

## 2018-12-05 LAB — TISSUE TRANSGLUTAMINASE, IGA: Transglutaminase IgA: <2 U/mL (ref 0–3)

## 2018-12-05 LAB — HEPATITIS B SURFACE ANTIBODY,QUALITATIVE: Hep B Surface Ab, Qual: REACTIVE

## 2018-12-05 LAB — HEPATITIS A ANTIBODY, TOTAL: hep A Total Ab: NEGATIVE

## 2018-12-05 LAB — IGA: IgA/Immunoglobulin A, Serum: 100 mg/dL (ref 87–352)

## 2018-12-08 ENCOUNTER — Other Ambulatory Visit (HOSPITAL_COMMUNITY): Payer: Self-pay | Admitting: Internal Medicine

## 2019-01-05 ENCOUNTER — Other Ambulatory Visit (HOSPITAL_COMMUNITY): Payer: Self-pay | Admitting: Nurse Practitioner

## 2019-01-05 DIAGNOSIS — Z1231 Encounter for screening mammogram for malignant neoplasm of breast: Secondary | ICD-10-CM

## 2019-01-06 ENCOUNTER — Ambulatory Visit (HOSPITAL_COMMUNITY)
Admission: RE | Admit: 2019-01-06 | Discharge: 2019-01-06 | Disposition: A | Discharge status: Home / Self Care | Payer: 59 | Source: Ambulatory Visit | Attending: Nurse Practitioner | Admitting: Nurse Practitioner

## 2019-01-06 ENCOUNTER — Other Ambulatory Visit: Payer: Self-pay

## 2019-01-06 DIAGNOSIS — Z1231 Encounter for screening mammogram for malignant neoplasm of breast: Secondary | ICD-10-CM | POA: Insufficient documentation

## 2019-02-01 ENCOUNTER — Encounter: Payer: Self-pay | Admitting: Gastroenterology

## 2019-02-01 ENCOUNTER — Telehealth: Payer: Self-pay | Admitting: Gastroenterology

## 2019-02-01 NOTE — Telephone Encounter (Signed)
PER GUIDELINE: ONE SIMPLE ADENOMA REMOVED 2015. PT CAN HAVE A COLONOSCOPY BETWEEN 2020 AND 2025.

## 2019-02-01 NOTE — Telephone Encounter (Signed)
Received records from [place], prior TCS with Dr. Britta Mccreedy.   TCS 04/2014, three sessile polyps removed, single tubular adenoma removed (size not provided on documentation), two other benign polyps, mild diverticulosis. New guidelines recommend tCS in 7-10 years vs 5-10. She had good prep.   Since Dr. Britta Mccreedy advised 3-5 year follow up, I will have patient address at time of f/u ov withSLF.   Dr. Oneida Alar, what would you advise for follow up TCS?

## 2019-02-02 NOTE — Telephone Encounter (Signed)
Pt is aware. Forwarding to [place] to nic.

## 2019-02-03 NOTE — Telephone Encounter (Signed)
ON RECALL  °

## 2019-04-01 ENCOUNTER — Other Ambulatory Visit: Payer: Self-pay

## 2019-04-01 DIAGNOSIS — Z20822 Contact with and (suspected) exposure to covid-19: Secondary | ICD-10-CM

## 2019-04-04 LAB — NOVEL CORONAVIRUS, NAA: SARS-CoV-2, NAA: NOT DETECTED

## 2019-04-14 ENCOUNTER — Telehealth: Payer: Self-pay | Admitting: Cardiovascular Disease

## 2019-04-14 ENCOUNTER — Encounter: Payer: Self-pay | Admitting: Cardiovascular Disease

## 2019-04-14 ENCOUNTER — Ambulatory Visit (INDEPENDENT_AMBULATORY_CARE_PROVIDER_SITE_OTHER): Payer: 59 | Admitting: Cardiovascular Disease

## 2019-04-14 ENCOUNTER — Other Ambulatory Visit: Payer: Self-pay

## 2019-04-14 VITALS — BP 140/72 | HR 81 | Ht 64.0 in | Wt 194.2 lb

## 2019-04-14 DIAGNOSIS — I10 Essential (primary) hypertension: Secondary | ICD-10-CM

## 2019-04-14 DIAGNOSIS — R002 Palpitations: Secondary | ICD-10-CM | POA: Diagnosis not present

## 2019-04-14 DIAGNOSIS — G4733 Obstructive sleep apnea (adult) (pediatric): Secondary | ICD-10-CM

## 2019-04-14 NOTE — Telephone Encounter (Signed)
°  Precert needed for:  2 D Echo dx: palpitationsLocation:     Date: [place] Apr 19, 2019

## 2019-04-14 NOTE — Progress Notes (Signed)
CARDIOLOGY CONSULT NOTE  Patient ID: Lindsey Mitchell MRN: [id number] DOB/AGE: 18-Jun-1955 63 y.o.  Admit date: (Not on file) Primary Physician: Berenice Primas  Reason for Consultation: Palpitations  HPI: Lindsey Mitchell is a 63 y.o. female who is being seen today for the evaluation of palpitations at the request of Berenice Primas, NP.   I personally reviewed the ECG performed on 04/13/2019 which demonstrated sinus rhythm with frequent PACs.  I reviewed labs performed yesterday which demonstrated normal TSH, potassium, sodium and renal function.  She has been experiencing palpitations for the past 2 to 3 weeks but they have increased in frequency and intensity over the past week.  She has sleep apnea but is unable to tolerate CPAP.  She has intermittent bilateral leg swelling.  She denies orthopnea and paroxysmal nocturnal dyspnea.  Family history: Father and mother had heart disease.  Mother had an arrhythmia.  3 brothers died of myocardial infarction.  She had a sister who had 4 stents and also died of MI.  Social history: Married.  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   Allergies  Allergen Reactions  . Relafen [Nabumetone] Swelling    Lip swelling  . Losartan Rash    Current Outpatient Medications  Medication Sig Dispense Refill  . candesartan-hydrochlorothiazide (ATACAND HCT) 16-12.5 MG tablet Take 1 tablet by mouth daily.    . cetirizine (ZYRTEC) 10 MG tablet Take 10 mg by mouth daily.    . Misc Natural Products (COLON CLEANSE PO) Take by mouth 2 (two) times daily as needed.    . valACYclovir (VALTREX) 500 MG tablet Take 500 mg by mouth daily as needed.     No current facility-administered medications for this visit.     Past Medical History:  Diagnosis Date  . Diverticulitis   . Hypertension     Past Surgical History:  Procedure Laterality Date  . ABDOMINAL HYSTERECTOMY    . COLONOSCOPY  04/2014   Dr. Britta Mccreedy: Mild diverticulosis  descending colon and sigmoid colon 3 sessile polyps removed, one tubular adenoma.  Repeat colonoscopy in 3 to 5 years  . COLONOSCOPY  2007   Dr. Oneida Alar: diverticulosis  . SMALL INTESTINE SURGERY     bowel obstruction    Social History   Socioeconomic History  . Marital status: Married    Spouse name: Not on file  . Number of children: Not on file  . Years of education: Not on file  . Highest education level: Not on file  Occupational History  . Not on file  Social Needs  . Financial resource strain: Not on file  . Food insecurity    Worry: Not on file    Inability: Not on file  . Transportation needs    Medical: Not on file    Non-medical: Not on file  Tobacco Use  . Smoking status: Never Smoker  . Smokeless tobacco: Never Used  Substance and Sexual Activity  . Alcohol use: Yes    Comment: socially  . Drug use: No  . Sexual activity: Not on file  Lifestyle  . Physical activity    Days per week: Not on file    Minutes per session: Not on file  . Stress: Not on file  Relationships  . Social Herbalist on phone: Not on file    Gets together: Not on file    Attends religious service: Not on file    Active member of club  or organization: Not on file    Attends meetings of clubs or organizations: Not on file    Relationship status: Not on file  . Intimate partner violence    Fear of current or ex partner: Not on file    Emotionally abused: Not on file    Physically abused: Not on file    Forced sexual activity: Not on file  Other Topics Concern  . Not on file  Social History Narrative  . Not on file      Current Meds  Medication Sig  . candesartan-hydrochlorothiazide (ATACAND HCT) 16-12.5 MG tablet Take 1 tablet by mouth daily.  . cetirizine (ZYRTEC) 10 MG tablet Take 10 mg by mouth daily.  . Misc Natural Products (COLON CLEANSE PO) Take by mouth 2 (two) times daily as needed.  . valACYclovir (VALTREX) 500 MG tablet Take 500 mg by mouth daily as  needed.      Review of systems complete and found to be negative unless listed above in HPI    Physical exam Blood pressure 140/72, pulse 81, height 5\' 4"  (1.626 m), weight 194 lb 3.2 oz (88.1 kg), SpO2 98 %. General: NAD Neck: No JVD, no thyromegaly or thyroid nodule.  Lungs: Clear to auscultation bilaterally with normal respiratory effort. CV: Nondisplaced PMI. Regular rate and rhythm, normal S1/S2, no S3/S4, no murmur.  No peripheral edema.  No carotid bruit.    Abdomen: Soft, nontender, no distention.  Skin: Intact without lesions or rashes.  Neurologic: Alert and oriented x 3.  Psych: Normal affect. Extremities: No clubbing or cyanosis.  HEENT: Normal.   ECG: Most recent ECG reviewed.   Labs: Lab Results  Component Value Date/Time   K 3.4 (L) 02/08/2016 05:30 PM   BUN 19 02/08/2016 05:30 PM   CREATININE 0.73 02/08/2016 05:30 PM   ALT 24 12/04/2018 08:52 AM   HGB 14.3 02/08/2016 05:30 PM     Lipids: Lab Results  Component Value Date/Time   LDLCALC 127 (H) 12/04/2018 08:51 AM   CHOL 223 (H) 12/04/2018 08:51 AM   TRIG 71 12/04/2018 08:51 AM   HDL 82 12/04/2018 08:51 AM        ASSESSMENT AND PLAN:  1.  Palpitations: ECG performed yesterday showed sinus rhythm with frequent PACs. I will order a 2-D echocardiogram with Doppler to evaluate cardiac structure, function, and regional wall motion. Given frequent atrial ectopy and untreated sleep apnea, she is certainly at risk for an atrial tachyarrhythmia such as atrial fibrillation.  I will obtain a 2-week event monitor.  Labs are normal.  2. Hypertension: Blood pressure is mildly elevated.  No changes to therapy.  3.  Obstructive sleep apnea: Unable to tolerate CPAP.  She sleeps on an inclined bed.   Disposition: Follow up in 3 months  Signed: Kate Sable, M.D., F.A.C.C.  04/14/2019, 1:42 PM

## 2019-04-14 NOTE — Patient Instructions (Addendum)
Medication Instructions:   Your physician recommends that you continue on your current medications as directed. Please refer to the Current Medication list given to you today.  Labwork:  NONE  Testing/Procedures: Your physician has requested that you have an echocardiogram. Echocardiography is a painless test that uses sound waves to create images of your heart. It provides your doctor with information about the size and shape of your heart and how well your heart's chambers and valves are working. This procedure takes approximately one hour. There are no restrictions for this procedure. Your physician has recommended that you wear an event monitor for 14 days. Event monitors are medical devices that record the heart's electrical activity. Doctors most often [place] these monitors to diagnose arrhythmias. Arrhythmias are problems with the speed or rhythm of the heartbeat. The monitor is a small, portable device. You can wear one while you do your normal daily activities. This is usually used to diagnose what is causing palpitations/syncope (passing out). [employer] will mail this monitor to your house.  Follow-Up:  Your physician recommends that you schedule a follow-up appointment in: 3 months.  Any Other Special Instructions Will Be Listed Below (If Applicable).  If you need a refill on your cardiac medications before your next appointment, please call your pharmacy.

## 2019-04-14 NOTE — Telephone Encounter (Signed)
°  Precert needed for:   14 Day Event Monitor dx: palpitations

## 2019-04-14 NOTE — Telephone Encounter (Signed)
Virtual Visit Pre-Appointment Phone Call  "(Name), I am calling you today to discuss your upcoming appointment. We are currently trying to limit exposure to the virus that causes COVID-19 by seeing patients at home rather than in the office."  1. "What is the BEST phone number to call the day of the visit?" - include this in appointment notes  2. Do you have or have access to (through a family member/friend) a smartphone with video capability that we can use for your visit?" a. If yes - list this number in appt notes as cell (if different from BEST phone #) and list the appointment type as a VIDEO visit in appointment notes b. If no - list the appointment type as a PHONE visit in appointment notes  3. Confirm consent - "In the setting of the current Covid19 crisis, you are scheduled for a (phone or video) visit with your provider on (date) at (time).  Just as we do with many in-office visits, in order for you to participate in this visit, we must obtain consent.  If you'd like, I can send this to your mychart (if signed up) or email for you to review.  Otherwise, I can obtain your verbal consent now.  All virtual visits are billed to your insurance company just like a normal visit would be.  By agreeing to a virtual visit, we'd like you to understand that the technology does not allow for your provider to perform an examination, and thus may limit your provider's ability to fully assess your condition. If your provider identifies any concerns that need to be evaluated in person, we will make arrangements to do so.  Finally, though the technology is pretty good, we cannot assure that it will always work on either your or our end, and in the setting of a video visit, we may have to convert it to a phone-only visit.  In either situation, we cannot ensure that we have a secure connection.  Are you willing to proceed?" STAFF: Did the patient verbally acknowledge consent to telehealth visit? Document  YES/NO here: yes  4. Advise patient to be prepared - "Two hours prior to your appointment, go ahead and check your blood pressure, pulse, oxygen saturation, and your weight (if you have the equipment to check those) and write them all down. When your visit starts, your provider will ask you for this information. If you have an Apple Watch or Kardia device, please plan to have heart rate information ready on the day of your appointment. Please have a pen and paper handy nearby the day of the visit as well."  5. Give patient instructions for MyChart download to smartphone OR [email or web address] as below if video visit (depending on what platform provider is using)  6. Inform patient they will receive a phone call 15 minutes prior to their appointment time (may be from unknown caller ID) so they should be prepared to answer    TELEPHONE CALL NOTE  Lindsey Mitchell has been deemed a candidate for a follow-up tele-health visit to limit community exposure during the Covid-19 pandemic. I spoke with the patient via phone to ensure availability of phone/video source, confirm preferred email & phone number, and discuss instructions and expectations.  I reminded Lindsey Mitchell to be prepared with any vital sign and/or heart rhythm information that could potentially be obtained via home monitoring, at the time of her visit. I reminded Lindsey Mitchell to expect a phone call prior to  her visit.  Lindsey Mitchell 04/14/2019 2:23 PM   INSTRUCTIONS FOR DOWNLOADING THE MYCHART APP TO SMARTPHONE  - The patient must first make sure to have activated MyChart and know their login information - If Apple, go to [employer] and type in MyChart in the search bar and download the app. If Android, ask patient to go to [employer] and type in [place] in the search bar and download the app. The app is free but as with any other app downloads, their phone may require them to verify saved payment information or Apple/Android  password.  - The patient will need to then log into the app with their MyChart username and password, and select  as their healthcare provider to link the account. When it is time for your visit, go to the MyChart app, find appointments, and click Begin Video Visit. Be sure to Select Allow for your device to access the Microphone and Camera for your visit. You will then be connected, and your provider will be with you shortly.  **If they have any issues connecting, or need assistance please contact MyChart service desk [phone])**  **If using a computer, in order to ensure the best quality for their visit they will need to use either of the following Internet Browsers: [employer], or Google Chrome**  IF USING DOXIMITY or DOXY.ME - The patient will receive a link just prior to their visit by text.     FULL LENGTH CONSENT FOR TELE-HEALTH VISIT   I hereby voluntarily request, consent and authorize [employer] and its employed or contracted physicians, physician assistants, nurse practitioners or other licensed health care professionals (the Practitioner), to provide me with telemedicine health care services (the Services") as deemed necessary by the treating Practitioner. I acknowledge and consent to receive the Services by the Practitioner via telemedicine. I understand that the telemedicine visit will involve communicating with the Practitioner through live audiovisual communication technology and the disclosure of certain medical information by electronic transmission. I acknowledge that I have been given the opportunity to request an in-person assessment or other available alternative prior to the telemedicine visit and am voluntarily participating in the telemedicine visit.  I understand that I have the right to withhold or withdraw my consent to the use of telemedicine in the course of my care at any time, without affecting my right to future care or treatment,  and that the Practitioner or I may terminate the telemedicine visit at any time. I understand that I have the right to inspect all information obtained and/or recorded in the course of the telemedicine visit and may receive copies of available information for a reasonable fee.  I understand that some of the potential risks of receiving the Services via telemedicine include:   Delay or interruption in medical evaluation due to technological equipment failure or disruption;  Information transmitted may not be sufficient (e.g. poor resolution of images) to allow for appropriate medical decision making by the Practitioner; and/or   In rare instances, security protocols could fail, causing a breach of personal health information.  Furthermore, I acknowledge that it is my responsibility to provide information about my medical history, conditions and care that is complete and accurate to the best of my ability. I acknowledge that Practitioner's advice, recommendations, and/or decision may be based on factors not within their control, such as incomplete or inaccurate data provided by me or distortions of diagnostic images or specimens that may result from electronic transmissions. I  understand that the practice of medicine is not an exact science and that Practitioner makes no warranties or guarantees regarding treatment outcomes. I acknowledge that I will receive a copy of this consent concurrently upon execution via email to the email address I last provided but may also request a printed copy by calling the office of [place].    I understand that my insurance will be billed for this visit.   I have read or had this consent read to me.  I understand the contents of this consent, which adequately explains the benefits and risks of the Services being provided via telemedicine.   I have been provided ample opportunity to ask questions regarding this consent and the Services and have had my questions  answered to my satisfaction.  I give my informed consent for the services to be provided through the use of telemedicine in my medical care  By participating in this telemedicine visit I agree to the above.

## 2019-04-19 ENCOUNTER — Ambulatory Visit (HOSPITAL_COMMUNITY)
Admission: RE | Admit: 2019-04-19 | Discharge: 2019-04-19 | Disposition: A | Discharge status: Home / Self Care | Payer: 59 | Source: Ambulatory Visit | Attending: Cardiovascular Disease | Admitting: Cardiovascular Disease

## 2019-04-19 ENCOUNTER — Other Ambulatory Visit: Payer: Self-pay

## 2019-04-19 DIAGNOSIS — R002 Palpitations: Secondary | ICD-10-CM | POA: Diagnosis present

## 2019-04-19 NOTE — Progress Notes (Signed)
*  PRELIMINARY RESULTS* Echocardiogram 2D Echocardiogram has been performed.  Lindsey Mitchell 04/19/2019, 9:11 AM

## 2019-04-20 ENCOUNTER — Telehealth: Payer: Self-pay | Admitting: *Deleted

## 2019-04-20 NOTE — Telephone Encounter (Signed)
Notes recorded by Laurine Blazer, LPN on [id number] at 579FGE AM EST  Patient notified. Copy to pmd. Follow up scheduled for March 2021 with Dr. Bronson Ing.  ------   Notes recorded by Herminio Commons, MD on 04/19/2019 at 10:48 AM EST  Normal pumping function. Mild stiffness with relaxation which is common with aging and sleep apnea. Chamber sizes are normal. Mild mitral and aortic valve leakage. Overall good echo findings.

## 2019-04-21 ENCOUNTER — Ambulatory Visit (INDEPENDENT_AMBULATORY_CARE_PROVIDER_SITE_OTHER): Payer: 59

## 2019-04-21 DIAGNOSIS — R002 Palpitations: Secondary | ICD-10-CM

## 2019-04-27 ENCOUNTER — Encounter: Payer: Self-pay | Admitting: Gastroenterology

## 2019-05-20 DIAGNOSIS — M9901 Segmental and somatic dysfunction of cervical region: Secondary | ICD-10-CM | POA: Diagnosis not present

## 2019-05-20 DIAGNOSIS — R002 Palpitations: Secondary | ICD-10-CM | POA: Diagnosis not present

## 2019-05-20 DIAGNOSIS — M5033 Other cervical disc degeneration, cervicothoracic region: Secondary | ICD-10-CM | POA: Diagnosis not present

## 2019-06-02 ENCOUNTER — Telehealth: Payer: Self-pay | Admitting: *Deleted

## 2019-06-02 MED ORDER — METOPROLOL TARTRATE 25 MG PO TABS: 25.0000 mg | ORAL_TABLET | Freq: Two times a day (BID) | ORAL | 1 refills | Status: DC

## 2019-06-02 NOTE — Telephone Encounter (Signed)
-----   Message from Herminio Commons, MD sent at 05/28/2019 11:33 AM EST ----- Sinus rhythm with PACs and rare PVCs. Several episodes of paroxysmal SVT but most of them were short bursts. There was an episode of Wenkebach also seen.  Can start Lopressor 25 mg bid for symptom control.

## 2019-06-02 NOTE — Telephone Encounter (Signed)
Pt voiced understanding and will start Lopressor - Medication sent to pharmacy - routed to pcp

## 2019-06-02 NOTE — Telephone Encounter (Signed)
LM to return call.

## 2019-06-02 NOTE — Telephone Encounter (Signed)
Returned call

## 2019-06-09 DIAGNOSIS — Z789 Other specified health status: Secondary | ICD-10-CM | POA: Diagnosis not present

## 2019-06-09 DIAGNOSIS — Z299 Encounter for prophylactic measures, unspecified: Secondary | ICD-10-CM | POA: Diagnosis not present

## 2019-06-15 DIAGNOSIS — Z789 Other specified health status: Secondary | ICD-10-CM | POA: Diagnosis not present

## 2019-06-15 DIAGNOSIS — Z299 Encounter for prophylactic measures, unspecified: Secondary | ICD-10-CM | POA: Diagnosis not present

## 2019-06-16 DIAGNOSIS — Z299 Encounter for prophylactic measures, unspecified: Secondary | ICD-10-CM | POA: Diagnosis not present

## 2019-06-16 DIAGNOSIS — Z789 Other specified health status: Secondary | ICD-10-CM | POA: Diagnosis not present

## 2019-06-19 DIAGNOSIS — R0603 Acute respiratory distress: Secondary | ICD-10-CM | POA: Diagnosis not present

## 2019-06-19 DIAGNOSIS — I700 Atherosclerosis of aorta: Secondary | ICD-10-CM | POA: Diagnosis not present

## 2019-06-19 DIAGNOSIS — R7402 Elevation of levels of lactic acid dehydrogenase (LDH): Secondary | ICD-10-CM | POA: Diagnosis not present

## 2019-06-19 DIAGNOSIS — R0602 Shortness of breath: Secondary | ICD-10-CM | POA: Diagnosis not present

## 2019-06-19 DIAGNOSIS — J1282 Pneumonia due to coronavirus disease 2019: Secondary | ICD-10-CM | POA: Diagnosis not present

## 2019-06-19 DIAGNOSIS — U071 COVID-19: Secondary | ICD-10-CM | POA: Diagnosis not present

## 2019-06-19 DIAGNOSIS — R0902 Hypoxemia: Secondary | ICD-10-CM | POA: Diagnosis not present

## 2019-06-19 DIAGNOSIS — I10 Essential (primary) hypertension: Secondary | ICD-10-CM | POA: Diagnosis not present

## 2019-06-20 DIAGNOSIS — I10 Essential (primary) hypertension: Secondary | ICD-10-CM | POA: Diagnosis not present

## 2019-06-20 DIAGNOSIS — R0603 Acute respiratory distress: Secondary | ICD-10-CM | POA: Diagnosis not present

## 2019-06-21 DIAGNOSIS — I700 Atherosclerosis of aorta: Secondary | ICD-10-CM | POA: Diagnosis not present

## 2019-06-21 DIAGNOSIS — I10 Essential (primary) hypertension: Secondary | ICD-10-CM | POA: Diagnosis not present

## 2019-06-21 DIAGNOSIS — R0603 Acute respiratory distress: Secondary | ICD-10-CM | POA: Diagnosis not present

## 2019-07-02 DIAGNOSIS — Z299 Encounter for prophylactic measures, unspecified: Secondary | ICD-10-CM | POA: Diagnosis not present

## 2019-07-02 DIAGNOSIS — Z09 Encounter for follow-up examination after completed treatment for conditions other than malignant neoplasm: Secondary | ICD-10-CM | POA: Diagnosis not present

## 2019-07-02 DIAGNOSIS — I10 Essential (primary) hypertension: Secondary | ICD-10-CM | POA: Diagnosis not present

## 2019-07-13 ENCOUNTER — Telehealth: Payer: Self-pay | Admitting: Cardiovascular Disease

## 2019-07-13 DIAGNOSIS — R5383 Other fatigue: Secondary | ICD-10-CM | POA: Diagnosis not present

## 2019-07-13 DIAGNOSIS — Z299 Encounter for prophylactic measures, unspecified: Secondary | ICD-10-CM | POA: Diagnosis not present

## 2019-07-13 DIAGNOSIS — I10 Essential (primary) hypertension: Secondary | ICD-10-CM | POA: Diagnosis not present

## 2019-07-13 DIAGNOSIS — J329 Chronic sinusitis, unspecified: Secondary | ICD-10-CM | POA: Diagnosis not present

## 2019-07-20 DIAGNOSIS — I10 Essential (primary) hypertension: Secondary | ICD-10-CM | POA: Diagnosis not present

## 2019-07-20 DIAGNOSIS — Z299 Encounter for prophylactic measures, unspecified: Secondary | ICD-10-CM | POA: Diagnosis not present

## 2019-07-20 DIAGNOSIS — J329 Chronic sinusitis, unspecified: Secondary | ICD-10-CM | POA: Diagnosis not present

## 2019-07-22 DIAGNOSIS — H16223 Keratoconjunctivitis sicca, not specified as Sjogren's, bilateral: Secondary | ICD-10-CM | POA: Diagnosis not present

## 2019-07-22 DIAGNOSIS — Z961 Presence of intraocular lens: Secondary | ICD-10-CM | POA: Diagnosis not present

## 2019-07-22 DIAGNOSIS — H04123 Dry eye syndrome of bilateral lacrimal glands: Secondary | ICD-10-CM | POA: Diagnosis not present

## 2019-07-22 DIAGNOSIS — H26493 Other secondary cataract, bilateral: Secondary | ICD-10-CM | POA: Diagnosis not present

## 2019-07-26 DIAGNOSIS — H26491 Other secondary cataract, right eye: Secondary | ICD-10-CM | POA: Diagnosis not present

## 2019-08-06 ENCOUNTER — Ambulatory Visit: Payer: Self-pay | Admitting: Cardiovascular Disease

## 2019-08-09 ENCOUNTER — Ambulatory Visit (INDEPENDENT_AMBULATORY_CARE_PROVIDER_SITE_OTHER): Payer: Medicare Other | Admitting: Cardiovascular Disease

## 2019-08-09 ENCOUNTER — Other Ambulatory Visit: Payer: Self-pay

## 2019-08-09 ENCOUNTER — Encounter: Payer: Self-pay | Admitting: Cardiovascular Disease

## 2019-08-09 VITALS — BP 130/90 | HR 89 | Ht 64.0 in | Wt 205.8 lb

## 2019-08-09 DIAGNOSIS — G4733 Obstructive sleep apnea (adult) (pediatric): Secondary | ICD-10-CM

## 2019-08-09 DIAGNOSIS — I10 Essential (primary) hypertension: Secondary | ICD-10-CM

## 2019-08-09 DIAGNOSIS — R002 Palpitations: Secondary | ICD-10-CM | POA: Diagnosis not present

## 2019-08-09 DIAGNOSIS — I471 Supraventricular tachycardia: Secondary | ICD-10-CM | POA: Diagnosis not present

## 2019-08-09 NOTE — Patient Instructions (Signed)
Medication Instructions:  Continue all current medications.  Labwork: none  Testing/Procedures: none  Follow-Up: 3 months   Any Other Special Instructions Will Be Listed Below (If Applicable).  If you need a refill on your cardiac medications before your next appointment, please call your pharmacy.  

## 2019-08-09 NOTE — Progress Notes (Signed)
SUBJECTIVE: The patient presents for follow-up after undergoing cardiac testing for the evaluation of palpitations.  Cardiac monitoring showed sinus rhythm with PACs and rare PVCs.  There were several episodes of paroxysmal SVT but most of them were short bursts.  There was an episode of Wenkebach also seen.  Echocardiogram on 04/19/2019 showed normal LV systolic function and grade 1 diastolic dysfunction, LVEF 6065%, mild LVH, and mild mitral and aortic regurgitation.  She has sleep apnea but is unable to tolerate CPAP.  She ended up getting COVID-19 pneumonia and was hospitalized for about a week.  She got off of oxygen about a week ago.  She feels fatigue and has some shortness of breath with exertion but this is gradually improving.  She denies chest pain and palpitations.  She only has leg swelling if she has been up on her feet for long periods of time.   Review of Systems: As per "subjective", otherwise negative.  Allergies  Allergen Reactions  . Relafen [Nabumetone] Swelling    Lip swelling  . Losartan Rash    Current Outpatient Medications  Medication Sig Dispense Refill  . candesartan-hydrochlorothiazide (ATACAND HCT) 16-12.5 MG tablet Take 1 tablet by mouth daily.    . cetirizine (ZYRTEC) 10 MG tablet Take 10 mg by mouth daily.     No current facility-administered medications for this visit.    Past Medical History:  Diagnosis Date  . Diverticulitis   . Hypertension     Past Surgical History:  Procedure Laterality Date  . ABDOMINAL HYSTERECTOMY    . COLONOSCOPY  04/2014   Dr. Britta Mccreedy: Mild diverticulosis descending colon and sigmoid colon 3 sessile polyps removed, one tubular adenoma.  Repeat colonoscopy in 3 to 5 years  . COLONOSCOPY  2007   Dr. Oneida Alar: diverticulosis  . SMALL INTESTINE SURGERY     bowel obstruction    Social History   Socioeconomic History  . Marital status: Married    Spouse name: Not on file  . Number of children: Not on file   . Years of education: Not on file  . Highest education level: Not on file  Occupational History  . Not on file  Tobacco Use  . Smoking status: Never Smoker  . Smokeless tobacco: Never Used  Substance and Sexual Activity  . Alcohol use: Yes    Comment: socially  . Drug use: No  . Sexual activity: Not on file  Other Topics Concern  . Not on file  Social History Narrative  . Not on file   Social Determinants of Health   Financial Resource Strain:   . Difficulty of Paying Living Expenses:   Food Insecurity:   . Worried About Charity fundraiser in the Last Year:   . Arboriculturist in the Last Year:   Transportation Needs:   . Film/video editor (Medical):   Marland Kitchen Lack of Transportation (Non-Medical):   Physical Activity:   . Days of Exercise per Week:   . Minutes of Exercise per Session:   Stress:   . Feeling of Stress :   Social Connections:   . Frequency of Communication with Friends and Family:   . Frequency of Social Gatherings with Friends and Family:   . Attends Religious Services:   . Active Member of Clubs or Organizations:   . Attends Archivist Meetings:   Marland Kitchen Marital Status:   Intimate Partner Violence:   . Fear of Current or Ex-Partner:   .  Emotionally Abused:   Marland Kitchen Physically Abused:   . Sexually Abused:     Orson Slick, LPN was present throughout the entirety of the encounter.  Vitals:   08/09/19 1433  BP: 130/90  Pulse: 89  SpO2: 97%  Weight: 205 lb 12.8 oz (93.4 kg)  Height: 5\' 4"  (1.626 m)    Wt Readings from Last 3 Encounters:  08/09/19 205 lb 12.8 oz (93.4 kg)  04/14/19 194 lb 3.2 oz (88.1 kg)  11/19/18 187 lb 6.4 oz (85 kg)     PHYSICAL EXAM General: NAD HEENT: Normal. Neck: No JVD, no thyromegaly. Lungs: Clear to auscultation bilaterally with normal respiratory effort. CV: Regular rate and rhythm with frequent premature contractions, normal S1/S2, no S3/S4, no murmur. No pretibial or periankle edema.  No carotid bruit.     Abdomen: Soft, nontender, no distention.  Neurologic: Alert and oriented.  Psych: Normal affect. Skin: Normal. Musculoskeletal: No gross deformities.      Labs: Lab Results  Component Value Date/Time   K 3.4 (L) 02/08/2016 05:30 PM   BUN 19 02/08/2016 05:30 PM   CREATININE 0.73 02/08/2016 05:30 PM   ALT 24 12/04/2018 08:52 AM   HGB 14.3 02/08/2016 05:30 PM     Lipids: Lab Results  Component Value Date/Time   LDLCALC 127 (H) 12/04/2018 08:51 AM   CHOL 223 (H) 12/04/2018 08:51 AM   TRIG 71 12/04/2018 08:51 AM   HDL 82 12/04/2018 08:51 AM       ASSESSMENT AND PLAN:  1.  Palpitations/PSVT: Cardiac monitoring sinus rhythm with PACs and rare PVCs.  There were several episodes of paroxysmal SVT but most of them were short bursts.  There was an episode of Wenkebach also seen.  Symptomatically stable.  She has not tried the Lopressor I previously prescribed.  This is reasonable as she is asymptomatic.  2.  Hypertension: Diastolic blood pressure is mildly elevated.  No changes to therapy for now.  3. Obstructive sleep apnea: Unable to tolerate CPAP.  She sleeps on an inclined bed.  I encouraged CPAP use and explained to her that obstructive sleep apnea left untreated can lead to atrial arrhythmias including atrial fibrillation.    Disposition: Follow up virtual visit 3 months   Kate Sable, M.D., F.A.C.C.

## 2019-08-10 DIAGNOSIS — H26492 Other secondary cataract, left eye: Secondary | ICD-10-CM | POA: Diagnosis not present

## 2019-09-23 DIAGNOSIS — Z1283 Encounter for screening for malignant neoplasm of skin: Secondary | ICD-10-CM | POA: Diagnosis not present

## 2019-09-23 DIAGNOSIS — Z08 Encounter for follow-up examination after completed treatment for malignant neoplasm: Secondary | ICD-10-CM | POA: Diagnosis not present

## 2019-09-23 DIAGNOSIS — L570 Actinic keratosis: Secondary | ICD-10-CM | POA: Diagnosis not present

## 2019-09-23 DIAGNOSIS — Z85820 Personal history of malignant melanoma of skin: Secondary | ICD-10-CM | POA: Diagnosis not present

## 2019-09-23 DIAGNOSIS — D225 Melanocytic nevi of trunk: Secondary | ICD-10-CM | POA: Diagnosis not present

## 2019-10-12 DIAGNOSIS — Z79899 Other long term (current) drug therapy: Secondary | ICD-10-CM | POA: Diagnosis not present

## 2019-10-12 DIAGNOSIS — E7800 Pure hypercholesterolemia, unspecified: Secondary | ICD-10-CM | POA: Diagnosis not present

## 2019-10-12 DIAGNOSIS — Z0000 Encounter for general adult medical examination without abnormal findings: Secondary | ICD-10-CM | POA: Diagnosis not present

## 2019-10-12 DIAGNOSIS — Z7189 Other specified counseling: Secondary | ICD-10-CM | POA: Diagnosis not present

## 2019-10-12 DIAGNOSIS — R5383 Other fatigue: Secondary | ICD-10-CM | POA: Diagnosis not present

## 2019-10-12 DIAGNOSIS — I10 Essential (primary) hypertension: Secondary | ICD-10-CM | POA: Diagnosis not present

## 2019-10-12 DIAGNOSIS — Z299 Encounter for prophylactic measures, unspecified: Secondary | ICD-10-CM | POA: Diagnosis not present

## 2019-11-08 ENCOUNTER — Telehealth: Payer: Medicare Other | Admitting: Cardiovascular Disease

## 2019-11-11 ENCOUNTER — Other Ambulatory Visit: Payer: Self-pay

## 2019-11-11 ENCOUNTER — Encounter: Payer: Self-pay | Admitting: Family Medicine

## 2019-11-11 ENCOUNTER — Ambulatory Visit: Payer: Medicare Other | Admitting: Family Medicine

## 2019-11-11 VITALS — BP 142/62 | HR 84 | Ht 64.0 in | Wt 198.0 lb

## 2019-11-11 DIAGNOSIS — G4733 Obstructive sleep apnea (adult) (pediatric): Secondary | ICD-10-CM | POA: Insufficient documentation

## 2019-11-11 DIAGNOSIS — R002 Palpitations: Secondary | ICD-10-CM | POA: Diagnosis not present

## 2019-11-11 DIAGNOSIS — I10 Essential (primary) hypertension: Secondary | ICD-10-CM | POA: Insufficient documentation

## 2019-11-11 NOTE — Progress Notes (Signed)
Cardiology Office Note  Date: 11/11/2019   ID: Lindsey Mitchell, DOB [birth year], MRN [id number]  PCP:  Lindsey Mitchell  Cardiologist:  Kate Sable, MD Electrophysiologist:  None   Chief Complaint: F/U PSVT  History of Present Illness: Lindsey Mitchell is a 64 y.o. female with a history of  PSVT, OSA not tolerant of  CPAP, Hx of COVID virus on O2 for awhile afterward. Still having fatigue and mild SOB but improving.  Had cardiac monitor showing PACs and rare PVCs. Several episodes of SVT short episodes. One episode of [place].  Echo 04/19/2019 Normal LV function EF 60-65% Grade I DD, Mild LVH, Mild mitral and aortic regurgitation.   Saw Dr Bronson Ing 08/09/2019: Denied and chest pain or palpitations. Leg swelling after being up during the day for long period of time. She was previously prescribed Lopressor for palpitation but had not taken any. She was symptomatically stable. She was encouraged to use CPAP. It was explained to her that untreated sleep apnea could lead to atrial arrhythmias including atrial fibrillation.  Patient states she has been doing well from a cardiac standpoint.  States she has occasional brief palpitations lasting only seconds.  States she has never had to take her metoprolol which was previously prescribed.  She was encouraged to use her CPAP at last visit.  Today she states she cannot tolerate using the CPAP device.  She denies any progressive anginal or exertional symptoms, palpitations or arrhythmias, orthostatic symptoms, CVA or TIA-like symptoms, claudication-like symptoms, DVT or PE-like symptoms.  She does have a reclining bed and sleeps with head of bed raised at an angle to help with her sleep apnea.  States she has mild lower extremity edema which is chronic.  Blood pressure is slightly elevated today but she states she has not taken her antihypertensive medication as of yet today.  Past Medical History:  Diagnosis Date   Diverticulitis     Hypertension     Past Surgical History:  Procedure Laterality Date   ABDOMINAL HYSTERECTOMY     COLONOSCOPY  04/2014   Dr. Britta Mccreedy: Mild diverticulosis descending colon and sigmoid colon 3 sessile polyps removed, one tubular adenoma.  Repeat colonoscopy in 3 to 5 years   COLONOSCOPY  2007   Dr. Oneida Alar: diverticulosis   SMALL INTESTINE SURGERY     bowel obstruction    Current Outpatient Medications  Medication Sig Dispense Refill   candesartan-hydrochlorothiazide (ATACAND HCT) 16-12.5 MG tablet Take 1 tablet by mouth daily.     No current facility-administered medications for this visit.   Allergies:  Relafen [nabumetone] and Losartan   Social History: The patient  reports that she has never smoked. She has never used smokeless tobacco. She reports current alcohol use. She reports that she does not use drugs.   Family History: The patient's family history includes Heart attack in her brother and sister; Heart failure in her father and mother; Hypertension in her father and another family member; Stroke in her brother.   ROS:  Please see the history of present illness. Otherwise, complete review of systems is positive for none.  All other systems are reviewed and negative.   Physical Exam: VS:  BP (!) 142/62    Pulse 84    Ht 5\' 4"  (1.626 m)    Wt 198 lb (89.8 kg)    SpO2 98%    BMI 33.99 kg/m , BMI Body mass index is 33.99 kg/m.  Wt Readings from Last 3 Encounters:  11/11/19  198 lb (89.8 kg)  08/09/19 205 lb 12.8 oz (93.4 kg)  04/14/19 194 lb 3.2 oz (88.1 kg)    General: Patient appears comfortable at rest. Neck: Supple, no elevated JVP or carotid bruits, no thyromegaly. Lungs: Clear to auscultation, nonlabored breathing at rest. Cardiac: Regular rate and rhythm, no S3 or significant systolic murmur, no pericardial rub. Extremities: No pitting edema, distal pulses 2+. Skin: Warm and dry. Musculoskeletal: No kyphosis. Neuropsychiatric: Alert and oriented x3, affect  grossly appropriate.  ECG:    Recent Labwork: 12/04/2018: ALT 24; AST 18     Component Value Date/Time   CHOL 223 (H) 12/04/2018 0851   TRIG 71 12/04/2018 0851   HDL 82 12/04/2018 0851   CHOLHDL 2.7 12/04/2018 0851   LDLCALC 127 (H) 12/04/2018 0851    Other Studies Reviewed Today:   Cardiac monitor 05/28/2019 Study Highlights   Sinus rhythm with PACs and rare PVCs. Several episodes of paroxysmal SVT but most of them were short bursts. There was an episode of [place] also seen.      Echo 04/19/2019  1. Left ventricular ejection fraction, by visual estimation, is 60 to 65%. The left ventricle has normal function. There is mildly increased left ventricular hypertrophy. 2. Left ventricular diastolic parameters are consistent with Grade I diastolic dysfunction (impaired relaxation). 3. The left ventricle has no regional wall motion abnormalities. 4. Global right ventricle has normal systolic function.The right ventricular size is normal. No increase in right ventricular wall thickness. 5. Left atrial size was normal. 6. Right atrial size was normal. 7. The mitral valve is grossly normal. Mild mitral valve regurgitation. 8. The tricuspid valve is normal in structure. Tricuspid valve regurgitation is not demonstrated. 9. The aortic valve is tricuspid. Aortic valve regurgitation is mild. No evidence of aortic valve sclerosis or stenosis. 10. The pulmonic valve was grossly normal. Pulmonic valve regurgitation is not visualized. 11. The inferior vena cava is normal in size with greater than 50% respiratory variability, suggesting right atrial pressure of 3 mmHg.  Assessment and Plan:  1. Palpitations   2. OSA (obstructive sleep apnea)   3. Essential hypertension    1. Palpitations Patient states she has occasional brief/transient palpitations sporadic in nature and not bothersome.  She has never used the metoprolol which was prescribed previously for the palpitations.  2.  OSA (obstructive sleep apnea) Positive for sleep apnea however is unable to tolerate the CPAP device.  3. Essential hypertension Blood pressure is elevated today on arrival at 142/62.  However she has not taken her antihypertensive medication today.  Advised her to take the medication earlier in the morning.  Continue candesartan HCTZ 16-12 0.5 mg daily.   Medication Adjustments/Labs and Tests Ordered: Current medicines are reviewed at length with the patient today.  Concerns regarding medicines are outlined above.   Disposition: Follow-up with cardiology or [facility] in 1 year  Signed, Levell July, NP 11/11/2019 1:38 PM    [facility] [address] [address], [place], [place] [address] Phone: [phone]; Fax: [phone]

## 2019-11-11 NOTE — Patient Instructions (Signed)

## 2019-11-29 DIAGNOSIS — I10 Essential (primary) hypertension: Secondary | ICD-10-CM | POA: Diagnosis not present

## 2019-11-29 DIAGNOSIS — Z299 Encounter for prophylactic measures, unspecified: Secondary | ICD-10-CM | POA: Diagnosis not present

## 2019-11-29 DIAGNOSIS — Z713 Dietary counseling and surveillance: Secondary | ICD-10-CM | POA: Diagnosis not present

## 2019-11-29 DIAGNOSIS — R079 Chest pain, unspecified: Secondary | ICD-10-CM | POA: Diagnosis not present

## 2019-11-30 DIAGNOSIS — R918 Other nonspecific abnormal finding of lung field: Secondary | ICD-10-CM | POA: Diagnosis not present

## 2019-11-30 DIAGNOSIS — I700 Atherosclerosis of aorta: Secondary | ICD-10-CM | POA: Diagnosis not present

## 2019-12-02 ENCOUNTER — Telehealth: Payer: Self-pay | Admitting: Family Medicine

## 2019-12-02 ENCOUNTER — Emergency Department (HOSPITAL_COMMUNITY)
Admission: EM | Admit: 2019-12-02 | Discharge: 2019-12-02 | Discharge status: Against Medical Advice | Payer: Medicare Other

## 2019-12-02 DIAGNOSIS — I700 Atherosclerosis of aorta: Secondary | ICD-10-CM | POA: Diagnosis not present

## 2019-12-02 DIAGNOSIS — Z880 Allergy status to penicillin: Secondary | ICD-10-CM | POA: Diagnosis not present

## 2019-12-02 DIAGNOSIS — I10 Essential (primary) hypertension: Secondary | ICD-10-CM | POA: Diagnosis not present

## 2019-12-02 DIAGNOSIS — I214 Non-ST elevation (NSTEMI) myocardial infarction: Secondary | ICD-10-CM | POA: Diagnosis not present

## 2019-12-02 DIAGNOSIS — R079 Chest pain, unspecified: Secondary | ICD-10-CM | POA: Diagnosis not present

## 2019-12-02 DIAGNOSIS — Z20822 Contact with and (suspected) exposure to covid-19: Secondary | ICD-10-CM | POA: Diagnosis not present

## 2019-12-02 DIAGNOSIS — K760 Fatty (change of) liver, not elsewhere classified: Secondary | ICD-10-CM | POA: Diagnosis not present

## 2019-12-02 NOTE — Telephone Encounter (Signed)
Pt c/o off and on chest pain for the last week rating pain today at the worse 8/10 with some SOB only when up walking around - pt says she was in [place] and chest was hurting she was SOB and was dizzy until she got to her car to sit down - has taken tylenol but this hasn't helped - denies any swelling or weight gain - pt aware that she need ED evaluation with increasing chest pain and SOB since last week - pt voiced understanding and will go to [facility] or [facility]

## 2019-12-02 NOTE — Telephone Encounter (Signed)
Pt called stating that she is having chest pain x 1 week-- stated that she saw PCP on Monday and they ordered labs to check D-dimer and a CT to eval for PE, all came back negative for PE.  Pt states she's still having chest pain- states she's having SOB and it's taking her breath away cause it's so painful.   When she's up moving around is when it beings to hurt  Pt was in [place] this time when it started and was dizzy, sob,- states no nausea   [phone]

## 2019-12-03 DIAGNOSIS — I214 Non-ST elevation (NSTEMI) myocardial infarction: Secondary | ICD-10-CM | POA: Diagnosis not present

## 2019-12-03 DIAGNOSIS — I10 Essential (primary) hypertension: Secondary | ICD-10-CM | POA: Diagnosis not present

## 2019-12-03 DIAGNOSIS — R0602 Shortness of breath: Secondary | ICD-10-CM | POA: Diagnosis not present

## 2019-12-03 DIAGNOSIS — Z955 Presence of coronary angioplasty implant and graft: Secondary | ICD-10-CM | POA: Diagnosis not present

## 2019-12-03 DIAGNOSIS — Z8616 Personal history of COVID-19: Secondary | ICD-10-CM | POA: Diagnosis not present

## 2019-12-03 DIAGNOSIS — R7989 Other specified abnormal findings of blood chemistry: Secondary | ICD-10-CM | POA: Diagnosis not present

## 2019-12-03 DIAGNOSIS — Z880 Allergy status to penicillin: Secondary | ICD-10-CM | POA: Diagnosis not present

## 2019-12-03 DIAGNOSIS — I252 Old myocardial infarction: Secondary | ICD-10-CM | POA: Diagnosis not present

## 2019-12-03 DIAGNOSIS — R079 Chest pain, unspecified: Secondary | ICD-10-CM | POA: Diagnosis not present

## 2019-12-03 DIAGNOSIS — E785 Hyperlipidemia, unspecified: Secondary | ICD-10-CM | POA: Diagnosis not present

## 2019-12-03 DIAGNOSIS — I2510 Atherosclerotic heart disease of native coronary artery without angina pectoris: Secondary | ICD-10-CM | POA: Diagnosis not present

## 2019-12-20 DIAGNOSIS — I2510 Atherosclerotic heart disease of native coronary artery without angina pectoris: Secondary | ICD-10-CM | POA: Diagnosis not present

## 2019-12-20 DIAGNOSIS — I252 Old myocardial infarction: Secondary | ICD-10-CM | POA: Diagnosis not present

## 2019-12-20 DIAGNOSIS — I10 Essential (primary) hypertension: Secondary | ICD-10-CM | POA: Diagnosis not present

## 2019-12-20 DIAGNOSIS — E782 Mixed hyperlipidemia: Secondary | ICD-10-CM | POA: Diagnosis not present

## 2019-12-21 DIAGNOSIS — E7800 Pure hypercholesterolemia, unspecified: Secondary | ICD-10-CM | POA: Diagnosis not present

## 2019-12-21 DIAGNOSIS — Z09 Encounter for follow-up examination after completed treatment for conditions other than malignant neoplasm: Secondary | ICD-10-CM | POA: Diagnosis not present

## 2019-12-21 DIAGNOSIS — Z299 Encounter for prophylactic measures, unspecified: Secondary | ICD-10-CM | POA: Diagnosis not present

## 2019-12-21 DIAGNOSIS — I25119 Atherosclerotic heart disease of native coronary artery with unspecified angina pectoris: Secondary | ICD-10-CM | POA: Diagnosis not present

## 2019-12-21 DIAGNOSIS — D692 Other nonthrombocytopenic purpura: Secondary | ICD-10-CM | POA: Diagnosis not present

## 2020-01-10 DIAGNOSIS — H43811 Vitreous degeneration, right eye: Secondary | ICD-10-CM | POA: Diagnosis not present

## 2020-01-10 DIAGNOSIS — H04123 Dry eye syndrome of bilateral lacrimal glands: Secondary | ICD-10-CM | POA: Diagnosis not present

## 2020-01-10 DIAGNOSIS — Z961 Presence of intraocular lens: Secondary | ICD-10-CM | POA: Diagnosis not present

## 2020-02-15 ENCOUNTER — Other Ambulatory Visit (HOSPITAL_COMMUNITY): Payer: Self-pay | Admitting: Internal Medicine

## 2020-02-15 DIAGNOSIS — Z1231 Encounter for screening mammogram for malignant neoplasm of breast: Secondary | ICD-10-CM

## 2020-02-16 ENCOUNTER — Encounter (HOSPITAL_COMMUNITY): Payer: Self-pay

## 2020-02-16 ENCOUNTER — Encounter (HOSPITAL_COMMUNITY)
Admission: RE | Admit: 2020-02-16 | Discharge: 2020-02-16 | Disposition: A | Discharge status: Home / Self Care | Payer: Medicare Other | Source: Ambulatory Visit | Attending: Internal Medicine | Admitting: Internal Medicine

## 2020-02-16 ENCOUNTER — Ambulatory Visit (HOSPITAL_COMMUNITY)
Admission: RE | Admit: 2020-02-16 | Discharge: 2020-02-16 | Disposition: A | Discharge status: Home / Self Care | Payer: Medicare Other | Source: Ambulatory Visit | Attending: Internal Medicine | Admitting: Internal Medicine

## 2020-02-16 ENCOUNTER — Other Ambulatory Visit: Payer: Self-pay

## 2020-02-16 VITALS — BP 150/70 | HR 67 | Ht 64.0 in | Wt 183.9 lb

## 2020-02-16 DIAGNOSIS — Z1231 Encounter for screening mammogram for malignant neoplasm of breast: Secondary | ICD-10-CM | POA: Insufficient documentation

## 2020-02-16 DIAGNOSIS — I214 Non-ST elevation (NSTEMI) myocardial infarction: Secondary | ICD-10-CM | POA: Insufficient documentation

## 2020-02-16 DIAGNOSIS — Z955 Presence of coronary angioplasty implant and graft: Secondary | ICD-10-CM | POA: Diagnosis not present

## 2020-02-16 NOTE — Progress Notes (Signed)
Cardiac Individual Treatment Plan  Patient Details  Name: Lindsey Mitchell MRN: [id number] Date of Birth: [birth year] Referring Provider:     CARDIAC REHAB PHASE II ORIENTATION from 02/16/2020 in [place]  Referring Provider Dr. Candis Musa      Initial Encounter Date:    CARDIAC REHAB PHASE II ORIENTATION from 02/16/2020 in [place]  Date 02/16/20      Visit Diagnosis: NSTEMI (non-ST elevated myocardial infarction) ([place])  S/P coronary artery stent placement  Patient's Home Medications on Admission:  Current Outpatient Medications:  .  aspirin EC 81 MG tablet, Take 81 mg by mouth daily. Swallow whole., Disp: , Rfl:  .  atorvastatin (LIPITOR) 80 MG tablet, Take 80 mg by mouth daily., Disp: , Rfl:  .  candesartan (ATACAND) 16 MG tablet, Take 16 mg by mouth daily., Disp: , Rfl:  .  cholecalciferol (VITAMIN D3) 25 MCG (1000 UNIT) tablet, Take 2,000 Units by mouth daily., Disp: , Rfl:  .  metoprolol succinate (TOPROL-XL) 25 MG 24 hr tablet, Take 25 mg by mouth daily., Disp: , Rfl:  .  ticagrelor (BRILINTA) 90 MG TABS tablet, Take 90 mg by mouth 2 (two) times daily., Disp: , Rfl:  .  candesartan-hydrochlorothiazide (ATACAND HCT) 16-12.5 MG tablet, Take 1 tablet by mouth daily. (Patient not taking: Reported on 02/16/2020), Disp: , Rfl:   Past Medical History: Past Medical History:  Diagnosis Date  . Diverticulitis   . Hypertension     Tobacco Use: Social History   Tobacco Use  Smoking Status Never Smoker  Smokeless Tobacco Never Used    Labs: Recent Review Scientist, physiological    Labs for ITP Cardiac and Pulmonary Rehab Latest Ref Rng & Units 12/04/2018   Cholestrol 100 - 199 mg/dL 223(H)   LDLCALC 0 - 99 mg/dL 127(H)   HDL >39 mg/dL 82   Trlycerides 0 - 149 mg/dL 71      Capillary Blood Glucose: No results found for: GLUCAP   Exercise Target Goals: Exercise Program Goal: Individual exercise prescription set using results from  initial 6 min walk test and THRR while considering  patient's activity barriers and safety.   Exercise Prescription Goal: Starting with aerobic activity 30 plus minutes a day, 3 days per week for initial exercise prescription. Provide home exercise prescription and guidelines that participant acknowledges understanding prior to discharge.  Activity Barriers & Risk Stratification:   6 Minute Walk:  6 Minute Walk    Row Name 02/16/20 1406         6 Minute Walk   Phase Initial     Distance 1550 feet     Walk Time 6 minutes     # of Rest Breaks 0     MPH 2.94     METS 3.35     RPE 11     VO2 Peak 11.73     Symptoms No     Resting HR 67 bpm     Resting BP 150/70     Resting Oxygen Saturation  97 %     Exercise Oxygen Saturation  during 6 min walk 98 %     Max Ex. HR 91 bpm     Max Ex. BP 154/62     2 Minute Post BP 148/66            Oxygen Initial Assessment:   Oxygen Re-Evaluation:   Oxygen Discharge (Final Oxygen Re-Evaluation):   Initial Exercise Prescription:  Initial Exercise Prescription - 02/16/20  1400      Date of Initial Exercise RX and Referring Provider   Date 02/16/20    Referring Provider Dr. Candis Musa    Expected Discharge Date 05/10/20      Treadmill   MPH 2.2    Grade 0    Minutes 17    METs 2.68      NuStep   Level 2    SPM 80    Minutes 22      Prescription Details   Frequency (times per week) 2    Duration Progress to 30 minutes of continuous aerobic without signs/symptoms of physical distress      Intensity   THRR 40-80% of Max Heartrate 62-123    Ratings of Perceived Exertion 11-13    Perceived Dyspnea 0-4      Resistance Training   Training Prescription Yes    Weight 2 lbs    Reps 10-15           Perform Capillary Blood Glucose checks as needed.  Exercise Prescription Changes:   Exercise Comments:   Exercise Goals and Review:   Exercise Goals    Row Name 02/16/20 1427             Exercise Goals   Increase  Physical Activity Yes       Intervention Provide advice, education, support and counseling about physical activity/exercise needs.;Develop an individualized exercise prescription for aerobic and resistive training based on initial evaluation findings, risk stratification, comorbidities and participant's personal goals.       Expected Outcomes Short Term: Attend rehab on a regular basis to increase amount of physical activity.;Long Term: Exercising regularly at least 3-5 days a week.;Long Term: Add in home exercise to make exercise part of routine and to increase amount of physical activity.       Increase Strength and Stamina Yes       Intervention Provide advice, education, support and counseling about physical activity/exercise needs.;Develop an individualized exercise prescription for aerobic and resistive training based on initial evaluation findings, risk stratification, comorbidities and participant's personal goals.       Expected Outcomes Short Term: Increase workloads from initial exercise prescription for resistance, speed, and METs.;Short Term: Perform resistance training exercises routinely during rehab and add in resistance training at home;Long Term: Improve cardiorespiratory fitness, muscular endurance and strength as measured by increased METs and functional capacity (6MWT)       Able to understand and use rate of perceived exertion (RPE) scale Yes       Intervention Provide education and explanation on how to use RPE scale       Expected Outcomes Short Term: Able to use RPE daily in rehab to express subjective intensity level;Long Term:  Able to use RPE to guide intensity level when exercising independently       Knowledge and understanding of Target Heart Rate Range (THRR) Yes       Intervention Provide education and explanation of THRR including how the numbers were predicted and where they are located for reference       Expected Outcomes Short Term: Able to state/look up THRR;Long  Term: Able to use THRR to govern intensity when exercising independently;Short Term: Able to use daily as guideline for intensity in rehab       Able to check pulse independently Yes       Intervention Provide education and demonstration on how to check pulse in carotid and radial arteries.;Review the importance of being able to check  your own pulse for safety during independent exercise       Expected Outcomes Short Term: Able to explain why pulse checking is important during independent exercise;Long Term: Able to check pulse independently and accurately       Understanding of Exercise Prescription Yes       Intervention Provide education, explanation, and written materials on patient's individual exercise prescription       Expected Outcomes Short Term: Able to explain program exercise prescription;Long Term: Able to explain home exercise prescription to exercise independently              Exercise Goals Re-Evaluation :    Discharge Exercise Prescription (Final Exercise Prescription Changes):   Nutrition:  Target Goals: Understanding of nutrition guidelines, daily intake of sodium 1500mg , cholesterol 200mg , calories 30% from fat and 7% or less from saturated fats, daily to have 5 or more servings of fruits and vegetables.  Biometrics:  Pre Biometrics - 02/16/20 1411      Pre Biometrics   Height 5\' 4"  (1.626 m)    Weight 83.4 kg    Waist Circumference 35.5 inches    Hip Circumference 45.5 inches    Waist to Hip Ratio 0.78 %    BMI (Calculated) 31.54    Triceps Skinfold 20 mm    % Body Fat 39.2 %    Grip Strength 30 kg    Flexibility 30 in    Single Leg Stand 21.93 seconds            Nutrition Therapy Plan and Nutrition Goals:  Nutrition Therapy & Goals - 02/16/20 1421      Personal Nutrition Goals   Comments Patient scored 36 on her medficts diet assessment. She says she follows a low salt low fat diet. She has lost some weight but would like to lose 10 lbs in the  program. Discussed her medificts score with her and a handout about making healthier choices. Will continue to monitor.      Intervention Plan   Intervention Nutrition handout(s) given to patient.           Nutrition Assessments:  Nutrition Assessments - 02/16/20 1422      MEDFICTS Scores   Pre Score 36           Nutrition Goals Re-Evaluation:   Nutrition Goals Discharge (Final Nutrition Goals Re-Evaluation):   Psychosocial: Target Goals: Acknowledge presence or absence of significant depression and/or stress, maximize coping skills, provide positive support system. Participant is able to verbalize types and ability to use techniques and skills needed for reducing stress and depression.  Initial Review & Psychosocial Screening:  Initial Psych Review & Screening - 02/16/20 1427      Initial Review   Current issues with None Identified      Family Dynamics   Good Support System? Yes    Comments Patient lives with her husband of 14 years. She has children and 2 grandchildren that she is very close to. She denies any depression or anxiety. She says her family is very supportative and she is looking forward to doing the program.      Barriers   Psychosocial barriers to participate in program There are no identifiable barriers or psychosocial needs.      Screening Interventions   Interventions Encouraged to exercise;Provide feedback about the scores to participant           Quality of Life Scores:  Quality of Life - 02/16/20 1405  Quality of Life   Select Quality of Life      Quality of Life Scores   Health/Function Pre 20.8 %    Socioeconomic Pre 25.14 %    Psych/Spiritual Pre 23.14 %    Family Pre 25.2 %    GLOBAL Pre 22.82 %          Scores of 19 and below usually indicate a poorer quality of life in these areas.  A difference of  2-3 points is a clinically meaningful difference.  A difference of 2-3 points in the total score of the Quality of Life  Index has been associated with significant improvement in overall quality of life, self-image, physical symptoms, and general health in studies assessing change in quality of life.  PHQ-9: Recent Review Flowsheet Data    Depression screen [facility] 2/9 02/16/2020   Decreased Interest 0   Down, Depressed, Hopeless 0   PHQ - 2 Score 0   Altered sleeping 0   Tired, decreased energy 0   Change in appetite 0   Feeling bad or failure about yourself  0   Trouble concentrating 0   Moving slowly or fidgety/restless 0   Suicidal thoughts 0   PHQ-9 Score 0     Interpretation of Total Score  Total Score Depression Severity:  1-4 = Minimal depression, 5-9 = Mild depression, 10-14 = Moderate depression, 15-19 = Moderately severe depression, 20-27 = Severe depression   Psychosocial Evaluation and Intervention:  Psychosocial Evaluation - 02/16/20 1428      Psychosocial Evaluation & Interventions   Interventions Stress management education;Relaxation education;Encouraged to exercise with the program and follow exercise prescription    Comments Patient's initial PHQ-9 score was 0 and her QOL score was 22.82 overall. She has no psychosocial issues identified at her orientation visit. Will continue to monitor.    Expected Outcomes Patient will have no psychosocial issues identified at discharge.    Continue Psychosocial Services  No Follow up required           Psychosocial Re-Evaluation:   Psychosocial Discharge (Final Psychosocial Re-Evaluation):   Vocational Rehabilitation: Provide vocational rehab assistance to qualifying candidates.   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 She does no need  vocational rehab.           Education: Education Goals: Education classes will be provided on a weekly basis, covering required topics. Participant will state understanding/return demonstration of topics presented.  Learning Barriers/Preferences:  Learning Barriers/Preferences - 02/16/20 1422      Learning Barriers/Preferences   Learning Barriers None    Learning Preferences Video;Written Material;Pictoral           Education Topics: Hypertension, Hypertension Reduction -Define heart disease and high blood pressure. Discus how high blood pressure affects the body and ways to reduce high blood pressure.   Exercise and Your Heart -Discuss why it is important to exercise, the FITT principles of exercise, normal and abnormal responses to exercise, and how to exercise safely.   Angina -Discuss definition of angina, causes of angina, treatment of angina, and how to decrease risk of having angina.   Cardiac Medications -Review what the following cardiac medications are used for, how they  affect the body, and side effects that may occur when taking the medications.  Medications include Aspirin, Beta blockers, calcium channel blockers, ACE Inhibitors, angiotensin receptor blockers, diuretics, digoxin, and antihyperlipidemics.   Congestive Heart Failure -Discuss the definition of CHF, how to live with CHF, the signs and symptoms of CHF, and how keep track of weight and sodium intake.   Heart Disease and Intimacy -Discus the effect sexual activity has on the heart, how changes occur during intimacy as we age, and safety during sexual activity.   Smoking Cessation / COPD -Discuss different methods to quit smoking, the health benefits of quitting smoking, and the definition of COPD.   Nutrition I: Fats -Discuss the types of cholesterol, what cholesterol does to the heart, and how cholesterol levels can be controlled.   Nutrition II: Labels -Discuss the different components of  food labels and how to read food label   Heart Parts/Heart Disease and PAD -Discuss the anatomy of the heart, the pathway of blood circulation through the heart, and these are affected by heart disease.   Stress I: Signs and Symptoms -Discuss the causes of stress, how stress may lead to anxiety and depression, and ways to limit stress.   Stress II: Relaxation -Discuss different types of relaxation techniques to limit stress.   Warning Signs of Stroke / TIA -Discuss definition of a stroke, what the signs and symptoms are of a stroke, and how to identify when someone is having stroke.   Knowledge Questionnaire Score:  Knowledge Questionnaire Score - 02/16/20 1423      Knowledge Questionnaire Score   Pre Score 22/24           Core Components/Risk Factors/Patient Goals at Admission:  Personal Goals and Risk Factors at Admission - 02/16/20 1423      Core Components/Risk Factors/Patient Goals on Admission    Weight Management Yes    Intervention Weight Management: Develop a combined nutrition and exercise program designed to reach desired caloric intake, while maintaining appropriate intake of nutrient and fiber, sodium and fats, and appropriate energy expenditure required for the weight goal.;Weight Management: Provide education and appropriate resources to help participant work on and attain dietary goals.;Weight Management/Obesity: Establish reasonable short term and long term weight goals.;Obesity: Provide education and appropriate resources to help participant work on and attain dietary goals.    Admit Weight 183 lb 7.7 oz (83.2 kg)    Goal Weight: Short Term 178 lb 7.7 oz (81 kg)    Goal Weight: Long Term 173 lb (78.5 kg)    Expected Outcomes Weight Loss: Understanding of general recommendations for a balanced deficit meal plan, which promotes 1-2 lb weight loss per week and includes a negative energy balance of [phone] kcal/d    Personal Goal Other Yes    Personal Goal Lose  10 lbs in the program. Return to doing what she was doing before her event. Be able to know what she can do. Be active again.    Intervention Patient will attend CR 3 days/week and supplement with exercise at home 3 days/week.    Expected Outcomes Patient will meet both program and personal goals.           Core Components/Risk Factors/Patient Goals Review:    Core Components/Risk Factors/Patient Goals at Discharge (Final Review):    ITP Comments:   Comments: Patient arrived for 1st visit/orientation/education at 1230. Patient was referred to CR by Dr. Cathie Hoops, D.O. due to NSTEMI (I21.4) and S/P Coronary Artery Stent placement (  Z95.5). During orientation advised patient on arrival and appointment times what to wear, what to do before, during and after exercise. Reviewed attendance and class policy. Talked about inclement weather and class consultation policy. Pt is scheduled to return Cardiac Rehab on 02/18/20 at 9:30. Pt was advised to come to class 15 minutes before class starts. Discussed RPE/Dpysnea scales. Discussed initial THR and how to find their radial and/or carotid pulse. Discussed the initial exercise prescription and how this effects their progress. Pt is eager to get started. Patient participated in warm up stretches followed by light weights and resistance bands. Patient was able to complete 6 minute walk test. Patient was measured for the equipment. Discussed equipment safety with patient. Took patient pre-anthropometric measurements. Patient finished visit at 1400.

## 2020-02-17 DIAGNOSIS — M722 Plantar fascial fibromatosis: Secondary | ICD-10-CM | POA: Diagnosis not present

## 2020-02-17 DIAGNOSIS — M7731 Calcaneal spur, right foot: Secondary | ICD-10-CM | POA: Diagnosis not present

## 2020-02-17 DIAGNOSIS — M79671 Pain in right foot: Secondary | ICD-10-CM | POA: Diagnosis not present

## 2020-02-18 ENCOUNTER — Other Ambulatory Visit: Payer: Self-pay

## 2020-02-18 ENCOUNTER — Encounter (HOSPITAL_COMMUNITY)
Admission: RE | Admit: 2020-02-18 | Discharge: 2020-02-18 | Disposition: A | Discharge status: Home / Self Care | Payer: Medicare Other | Source: Ambulatory Visit | Attending: Internal Medicine | Admitting: Internal Medicine

## 2020-02-18 DIAGNOSIS — Z955 Presence of coronary angioplasty implant and graft: Secondary | ICD-10-CM

## 2020-02-18 DIAGNOSIS — I214 Non-ST elevation (NSTEMI) myocardial infarction: Secondary | ICD-10-CM

## 2020-02-18 NOTE — Progress Notes (Signed)
Daily Session Note  Patient Details  Name: Lindsey Mitchell MRN: [id number] Date of Birth: Jan 31, 1956 Referring Provider:     [place] from 02/16/2020 in [place]  Referring Provider Dr. Candis Musa      Encounter Date: 02/18/2020  Check In:  Session Check In - 02/18/20 0958      Check-In   Supervising physician immediately available to respond to emergencies See telemetry face sheet for immediately available MD    Location [facility]    Staff Present Aundra Dubin, RN, Bjorn Loser, MS, ACSM-CEP, Exercise Physiologist    Virtual Visit No    Medication changes reported     No    Fall or balance concerns reported    No    Warm-up and Cool-down Performed as group-led instruction    Resistance Training Performed Yes    VAD Patient? No    PAD/SET Patient? No      Pain Assessment   Currently in Pain? No/denies    Pain Score 0-No pain    Multiple Pain Sites No           Capillary Blood Glucose: No results found for this or any previous visit (from the past 24 hour(s)).    Social History   Tobacco Use  Smoking Status Never Smoker  Smokeless Tobacco Never Used    Goals Met:  Independence with exercise equipment Exercise tolerated well No report of cardiac concerns or symptoms Strength training completed today  Goals Unmet:  Not Applicable  Comments: checkout time is 1030   Dr. Kathie Dike is Medical Director for [facility] Pulmonary Rehab.

## 2020-02-21 ENCOUNTER — Encounter (HOSPITAL_COMMUNITY)
Admission: RE | Admit: 2020-02-21 | Discharge: 2020-02-21 | Disposition: A | Discharge status: Home / Self Care | Payer: Medicare Other | Source: Ambulatory Visit | Attending: Internal Medicine | Admitting: Internal Medicine

## 2020-02-21 ENCOUNTER — Encounter (HOSPITAL_COMMUNITY): Payer: Medicare Other

## 2020-02-21 ENCOUNTER — Other Ambulatory Visit: Payer: Self-pay

## 2020-02-21 DIAGNOSIS — I214 Non-ST elevation (NSTEMI) myocardial infarction: Secondary | ICD-10-CM | POA: Diagnosis not present

## 2020-02-21 DIAGNOSIS — Z955 Presence of coronary angioplasty implant and graft: Secondary | ICD-10-CM | POA: Diagnosis not present

## 2020-02-21 NOTE — Progress Notes (Signed)
Daily Session Note  Patient Details  Name: Lindsey Mitchell MRN: [id number] Date of Birth: 14-Jan-1956 Referring Provider:     CARDIAC REHAB PHASE II ORIENTATION from 02/16/2020 in [place]  Referring Provider Dr. Candis Musa      Encounter Date: 02/21/2020  Check In:  Session Check In - 02/21/20 0941      Check-In   Supervising physician immediately available to respond to emergencies See telemetry face sheet for immediately available MD    Location [facility]    Staff Present Aundra Dubin, RN, Bjorn Loser, MS, ACSM-CEP, Exercise Physiologist    Virtual Visit No    Medication changes reported     No    Fall or balance concerns reported    No    Warm-up and Cool-down Performed as group-led instruction    Resistance Training Performed Yes    VAD Patient? No    PAD/SET Patient? No      Pain Assessment   Currently in Pain? No/denies    Pain Score 0-No pain    Multiple Pain Sites No           Capillary Blood Glucose: No results found for this or any previous visit (from the past 24 hour(s)).    Social History   Tobacco Use  Smoking Status Never Smoker  Smokeless Tobacco Never Used    Goals Met:  Independence with exercise equipment Exercise tolerated well No report of cardiac concerns or symptoms Strength training completed today  Goals Unmet:  Not Applicable  Comments: checkout time is 1030   Dr. Kathie Dike is Medical Director for [facility] Pulmonary Rehab.

## 2020-02-23 ENCOUNTER — Other Ambulatory Visit: Payer: Self-pay

## 2020-02-23 ENCOUNTER — Encounter (HOSPITAL_COMMUNITY)
Admission: RE | Admit: 2020-02-23 | Discharge: 2020-02-23 | Disposition: A | Discharge status: Home / Self Care | Payer: Medicare Other | Source: Ambulatory Visit | Attending: Internal Medicine | Admitting: Internal Medicine

## 2020-02-23 DIAGNOSIS — Z955 Presence of coronary angioplasty implant and graft: Secondary | ICD-10-CM | POA: Diagnosis not present

## 2020-02-23 DIAGNOSIS — I214 Non-ST elevation (NSTEMI) myocardial infarction: Secondary | ICD-10-CM

## 2020-02-23 NOTE — Progress Notes (Signed)
I have reviewed a Home Exercise Prescription with Lindsey Mitchell . Lindsey Mitchell is currently exercising at home.  The patient was advised to walk and do aquatics at the [facility] 2 days a week for 30-45 minutes.  [name] and I discussed how to progress their exercise prescription.  The patient stated that their goals were to gain confidence in her physical abilities again.  The patient stated that they understand the exercise prescription.  We reviewed exercise guidelines, target heart rate during exercise, RPE Scale, weather conditions, NTG use, endpoints for exercise, warmup and cool down.  Patient is encouraged to come to me with any questions. I will continue to follow up with the patient to assist them with progression and safety.

## 2020-02-23 NOTE — Progress Notes (Signed)
Daily Session Note  Patient Details  Name: GWYNNETH FABIO MRN: [id number] Date of Birth: 06/07/1955 Referring Provider:     [place] from 02/16/2020 in [place]  Referring Provider Dr. Candis Musa      Encounter Date: 02/23/2020  Check In:  Session Check In - 02/23/20 0938      Check-In   Supervising physician immediately available to respond to emergencies [facility] MD immediately available    Physician(s) Dr. Domenic Polite    Location [facility]    Staff Present Aundra Dubin, RN, Bjorn Loser, MS, ACSM-CEP, Exercise Physiologist    Virtual Visit No    Medication changes reported     No    Fall or balance concerns reported    No    Tobacco Cessation No Change    Warm-up and Cool-down Performed as group-led instruction    Resistance Training Performed Yes    VAD Patient? No    PAD/SET Patient? No      Pain Assessment   Currently in Pain? No/denies    Pain Score 0-No pain    Multiple Pain Sites No           Capillary Blood Glucose: No results found for this or any previous visit (from the past 24 hour(s)).    Social History   Tobacco Use  Smoking Status Never Smoker  Smokeless Tobacco Never Used    Goals Met:  Independence with exercise equipment Exercise tolerated well No report of cardiac concerns or symptoms Strength training completed today  Goals Unmet:  Not Applicable  Comments: checkout time is 1030   Dr. Kathie Dike is Medical Director for [facility] Pulmonary Rehab.

## 2020-02-24 ENCOUNTER — Encounter (HOSPITAL_COMMUNITY): Payer: Medicare Other

## 2020-02-25 ENCOUNTER — Encounter (HOSPITAL_COMMUNITY)
Admission: RE | Admit: 2020-02-25 | Discharge: 2020-02-25 | Disposition: A | Discharge status: Home / Self Care | Payer: Medicare Other | Source: Ambulatory Visit | Attending: Internal Medicine | Admitting: Internal Medicine

## 2020-02-25 ENCOUNTER — Other Ambulatory Visit: Payer: Self-pay

## 2020-02-25 DIAGNOSIS — I214 Non-ST elevation (NSTEMI) myocardial infarction: Secondary | ICD-10-CM | POA: Diagnosis not present

## 2020-02-25 DIAGNOSIS — Z955 Presence of coronary angioplasty implant and graft: Secondary | ICD-10-CM | POA: Diagnosis not present

## 2020-02-25 NOTE — Progress Notes (Signed)
Daily Session Note  Patient Details  Name: Lindsey Mitchell MRN: [id number] Date of Birth: [birth year] Referring Provider:     CARDIAC REHAB PHASE II ORIENTATION from 02/16/2020 in [place]  Referring Provider Dr. Candis Musa      Encounter Date: 02/25/2020  Check In:  Session Check In - 02/25/20 0930      Check-In   Supervising physician immediately available to respond to emergencies [facility] MD immediately available    Physician(s) Dr. Harrington Challenger    Location [facility]    Staff Present Geanie Cooley, RN;Dalton Fletcher, MS, ACSM-CEP, Exercise Physiologist    Virtual Visit No    Medication changes reported     No    Fall or balance concerns reported    No    Tobacco Cessation No Change    Warm-up and Cool-down Performed as group-led instruction    Resistance Training Performed Yes    VAD Patient? No    PAD/SET Patient? No      Pain Assessment   Currently in Pain? No/denies    Pain Score 0-No pain    Multiple Pain Sites No           Capillary Blood Glucose: No results found for this or any previous visit (from the past 24 hour(s)).    Social History   Tobacco Use  Smoking Status Never Smoker  Smokeless Tobacco Never Used    Goals Met:  Independence with exercise equipment Exercise tolerated well Personal goals reviewed No report of cardiac concerns or symptoms Strength training completed today  Goals Unmet:  Not Applicable  Comments: check out @ 10:30   Dr. Kathie Dike is Medical Director for [facility] Pulmonary Rehab.

## 2020-02-28 ENCOUNTER — Encounter (HOSPITAL_COMMUNITY)
Admission: RE | Admit: 2020-02-28 | Discharge: 2020-02-28 | Disposition: A | Discharge status: Home / Self Care | Payer: Medicare Other | Source: Ambulatory Visit | Attending: Internal Medicine | Admitting: Internal Medicine

## 2020-02-28 ENCOUNTER — Other Ambulatory Visit: Payer: Self-pay

## 2020-02-28 VITALS — Wt 183.0 lb

## 2020-02-28 DIAGNOSIS — I214 Non-ST elevation (NSTEMI) myocardial infarction: Secondary | ICD-10-CM

## 2020-02-28 DIAGNOSIS — Z955 Presence of coronary angioplasty implant and graft: Secondary | ICD-10-CM | POA: Diagnosis not present

## 2020-02-28 NOTE — Progress Notes (Signed)
Daily Session Note  Patient Details  Name: Lindsey Mitchell MRN: [id number] Date of Birth: Oct 18, 1955 Referring Provider:     CARDIAC REHAB PHASE II ORIENTATION from 02/16/2020 in [place]  Referring Provider Dr. Candis Musa      Encounter Date: 02/28/2020  Check In:  Session Check In - 02/28/20 0934      Check-In   Supervising physician immediately available to respond to emergencies [facility] MD immediately available    Physician(s) Dr. Harl Bowie    Location [facility]    Staff Present Ramon Dredge, RN, MHA;Dalton Kris Mouton, MS, ACSM-CEP, Exercise Physiologist    Virtual Visit No    Medication changes reported     No    Fall or balance concerns reported    No    Tobacco Cessation No Change    Warm-up and Cool-down Performed as group-led instruction    Resistance Training Performed Yes    VAD Patient? No    PAD/SET Patient? No      Pain Assessment   Currently in Pain? No/denies    Pain Score 0-No pain    Multiple Pain Sites No           Capillary Blood Glucose: No results found for this or any previous visit (from the past 24 hour(s)).    Social History   Tobacco Use  Smoking Status Never Smoker  Smokeless Tobacco Never Used    Goals Met:  Independence with exercise equipment Exercise tolerated well No report of cardiac concerns or symptoms Strength training completed today  Goals Unmet:  Not Applicable  Comments: [id number]   Dr. Kathie Dike is Medical Director for [facility].

## 2020-03-01 ENCOUNTER — Other Ambulatory Visit: Payer: Self-pay

## 2020-03-01 ENCOUNTER — Encounter (HOSPITAL_COMMUNITY)
Admission: RE | Admit: 2020-03-01 | Discharge: 2020-03-01 | Disposition: A | Discharge status: Home / Self Care | Payer: Medicare Other | Source: Ambulatory Visit | Attending: Internal Medicine | Admitting: Internal Medicine

## 2020-03-01 DIAGNOSIS — I214 Non-ST elevation (NSTEMI) myocardial infarction: Secondary | ICD-10-CM

## 2020-03-01 DIAGNOSIS — Z955 Presence of coronary angioplasty implant and graft: Secondary | ICD-10-CM | POA: Diagnosis not present

## 2020-03-01 NOTE — Progress Notes (Signed)
Daily Session Note  Patient Details  Name: Lindsey Mitchell MRN: [id number] Date of Birth: January 17, 1956 Referring Provider:     CARDIAC REHAB PHASE II ORIENTATION from 02/16/2020 in [place]  Referring Provider Dr. Candis Musa      Encounter Date: 03/01/2020  Check In:  Session Check In - 03/01/20 0930      Check-In   Supervising physician immediately available to respond to emergencies [facility] MD immediately available    Physician(s) Domenic Polite    Location [facility]    Staff Present Geanie Cooley, Felipe Drone, RN, [facility]    Virtual Visit No    Medication changes reported     No    Fall or balance concerns reported    No    Tobacco Cessation No Change    Warm-up and Cool-down Performed as group-led instruction    Resistance Training Performed Yes    VAD Patient? No    PAD/SET Patient? No      Pain Assessment   Currently in Pain? No/denies    Pain Score 0-No pain    Multiple Pain Sites No           Capillary Blood Glucose: No results found for this or any previous visit (from the past 24 hour(s)).    Social History   Tobacco Use  Smoking Status Never Smoker  Smokeless Tobacco Never Used    Goals Met:  Independence with exercise equipment Exercise tolerated well No report of cardiac concerns or symptoms Strength training completed today  Goals Unmet:  Not Applicable  Comments: check out @ 10:30   Dr. Kathie Dike is Medical Director for [facility].

## 2020-03-03 ENCOUNTER — Other Ambulatory Visit: Payer: Self-pay

## 2020-03-03 ENCOUNTER — Encounter (HOSPITAL_COMMUNITY)
Admission: RE | Admit: 2020-03-03 | Discharge: 2020-03-03 | Disposition: A | Discharge status: Home / Self Care | Payer: Medicare Other | Source: Ambulatory Visit | Attending: Internal Medicine | Admitting: Internal Medicine

## 2020-03-03 DIAGNOSIS — Z955 Presence of coronary angioplasty implant and graft: Secondary | ICD-10-CM | POA: Diagnosis not present

## 2020-03-03 DIAGNOSIS — I214 Non-ST elevation (NSTEMI) myocardial infarction: Secondary | ICD-10-CM

## 2020-03-03 NOTE — Progress Notes (Signed)
Daily Session Note  Patient Details  Name: Lindsey Mitchell MRN: [id number] Date of Birth: January 10, 1956 Referring Provider:     CARDIAC REHAB PHASE II ORIENTATION from 02/16/2020 in [place]  Referring Provider Dr. Candis Musa      Encounter Date: 03/03/2020  Check In:  Session Check In - 03/03/20 0930      Check-In   Supervising physician immediately available to respond to emergencies [facility] immediately available    Physician(s) Dr. Harl Bowie    Location [facility]    Staff Present Ramon Dredge, RN, MHA;Dalton Kris Mouton, MS, ACSM-CEP, Exercise Physiologist    Virtual Visit No    Medication changes reported     No    Fall or balance concerns reported    No    Tobacco Cessation No Change    Warm-up and Cool-down Performed as group-led instruction    Resistance Training Performed Yes    VAD Patient? No    PAD/SET Patient? No      Pain Assessment   Currently in Pain? No/denies    Pain Score 0-No pain    Multiple Pain Sites No           Capillary Blood Glucose: No results found for this or any previous visit (from the past 24 hour(s)).    Social History   Tobacco Use  Smoking Status Never Smoker  Smokeless Tobacco Never Used    Goals Met:  Independence with exercise equipment Exercise tolerated well No report of cardiac concerns or symptoms Strength training completed today  Goals Unmet:  Not Applicable  Comments: [phone]   Dr. Kathie Dike is Medical Director for [facility] Re[facility].

## 2020-03-06 ENCOUNTER — Encounter (HOSPITAL_COMMUNITY)
Admission: RE | Admit: 2020-03-06 | Discharge: 2020-03-06 | Disposition: A | Discharge status: Home / Self Care | Payer: Medicare Other | Source: Ambulatory Visit | Attending: Internal Medicine | Admitting: Internal Medicine

## 2020-03-06 ENCOUNTER — Other Ambulatory Visit: Payer: Self-pay

## 2020-03-06 VITALS — Wt 183.9 lb

## 2020-03-06 DIAGNOSIS — I214 Non-ST elevation (NSTEMI) myocardial infarction: Secondary | ICD-10-CM

## 2020-03-06 DIAGNOSIS — Z955 Presence of coronary angioplasty implant and graft: Secondary | ICD-10-CM | POA: Diagnosis not present

## 2020-03-06 NOTE — Progress Notes (Signed)
Daily Session Note  Patient Details  Name: ELISSA GRIESHOP MRN: [id number] Date of Birth: Mar 03, 1956 Referring Provider:     CARDIAC REHAB PHASE II ORIENTATION from 02/16/2020 in [place]  Referring Provider Dr. Candis Musa      Encounter Date: 03/06/2020  Check In:  Session Check In - 03/06/20 0941      Check-In   Supervising physician immediately available to respond to emergencies [facility] immediately available    Physician(s) Dr. Harl Bowie    Location [facility]    Staff Present Aundra Dubin, RN, Bjorn Loser, MS, ACSM-CEP, Exercise Physiologist    Virtual Visit No    Medication changes reported     No    Fall or balance concerns reported    No    Tobacco Cessation No Change    Warm-up and Cool-down Performed as group-led instruction    Resistance Training Performed Yes    VAD Patient? No    PAD/SET Patient? No      Pain Assessment   Currently in Pain? No/denies    Pain Score 0-No pain    Multiple Pain Sites No           Capillary Blood Glucose: No results found for this or any previous visit (from the past 24 hour(s)).    Social History   Tobacco Use  Smoking Status Never Smoker  Smokeless Tobacco Never Used    Goals Met:  Independence with exercise equipment Exercise tolerated well No report of cardiac concerns or symptoms Strength training completed today  Goals Unmet:  Not Applicable  Comments: checkout time is 1030   Dr. Kathie Dike is Medical Director for [facility] Pulmonary Re[facility].

## 2020-03-07 DIAGNOSIS — R079 Chest pain, unspecified: Secondary | ICD-10-CM | POA: Diagnosis not present

## 2020-03-07 DIAGNOSIS — Z955 Presence of coronary angioplasty implant and graft: Secondary | ICD-10-CM | POA: Diagnosis not present

## 2020-03-07 DIAGNOSIS — I10 Essential (primary) hypertension: Secondary | ICD-10-CM | POA: Diagnosis not present

## 2020-03-07 DIAGNOSIS — I252 Old myocardial infarction: Secondary | ICD-10-CM | POA: Diagnosis not present

## 2020-03-07 DIAGNOSIS — R0789 Other chest pain: Secondary | ICD-10-CM | POA: Diagnosis not present

## 2020-03-08 ENCOUNTER — Encounter (HOSPITAL_COMMUNITY)
Admission: RE | Admit: 2020-03-08 | Discharge: 2020-03-08 | Disposition: A | Discharge status: Home / Self Care | Payer: Medicare Other | Source: Ambulatory Visit | Attending: Internal Medicine | Admitting: Internal Medicine

## 2020-03-08 ENCOUNTER — Other Ambulatory Visit: Payer: Self-pay

## 2020-03-08 DIAGNOSIS — I214 Non-ST elevation (NSTEMI) myocardial infarction: Secondary | ICD-10-CM

## 2020-03-08 DIAGNOSIS — Z955 Presence of coronary angioplasty implant and graft: Secondary | ICD-10-CM | POA: Diagnosis not present

## 2020-03-08 NOTE — Progress Notes (Signed)
Cardiac Individual Treatment Plan  Patient Details  Name: Lindsey Mitchell MRN: [id number] Date of Birth: 1956/03/07 Referring Provider:     CARDIAC REHAB PHASE II ORIENTATION from 02/16/2020 in [place]  Referring Provider Dr. Candis Musa      Initial Encounter Date:    CARDIAC REHAB PHASE II ORIENTATION from 02/16/2020 in [place]  Date 02/16/20      Visit Diagnosis: NSTEMI (non-ST elevated myocardial infarction) ([place])  S/P coronary artery stent placement  Patient's Home Medications on Admission:  Current Outpatient Medications:    aspirin EC 81 MG tablet, Take 81 mg by mouth daily. Swallow whole., Disp: , Rfl:    atorvastatin (LIPITOR) 80 MG tablet, Take 80 mg by mouth daily., Disp: , Rfl:    candesartan (ATACAND) 16 MG tablet, Take 16 mg by mouth daily., Disp: , Rfl:    candesartan-hydrochlorothiazide (ATACAND HCT) 16-12.5 MG tablet, Take 1 tablet by mouth daily. (Patient not taking: Reported on 02/16/2020), Disp: , Rfl:    cholecalciferol (VITAMIN D3) 25 MCG (1000 UNIT) tablet, Take 2,000 Units by mouth daily., Disp: , Rfl:    metoprolol succinate (TOPROL-XL) 25 MG 24 hr tablet, Take 25 mg by mouth daily., Disp: , Rfl:    ticagrelor (BRILINTA) 90 MG TABS tablet, Take 90 mg by mouth 2 (two) times daily., Disp: , Rfl:   Past Medical History: Past Medical History:  Diagnosis Date   Diverticulitis    Hypertension     Tobacco Use: Social History   Tobacco Use  Smoking Status Never Smoker  Smokeless Tobacco Never Used    Labs: Recent Review Scientist, physiological    Labs for ITP Cardiac and Pulmonary Rehab Latest Ref Rng & Units 12/04/2018   Cholestrol 100 - 199 mg/dL 223(H)   LDLCALC 0 - 99 mg/dL 127(H)   HDL >39 mg/dL 82   Trlycerides 0 - 149 mg/dL 71      Capillary Blood Glucose: No results found for: GLUCAP   Exercise Target Goals: Exercise Program Goal: Individual exercise prescription set using results from  initial 6 min walk test and THRR while considering  patients activity barriers and safety.   Exercise Prescription Goal: Starting with aerobic activity 30 plus minutes a day, 3 days per week for initial exercise prescription. Provide home exercise prescription and guidelines that participant acknowledges understanding prior to discharge.  Activity Barriers & Risk Stratification:   6 Minute Walk:  6 Minute Walk    Row Name 02/16/20 1406         6 Minute Walk   Phase Initial     Distance 1550 feet     Walk Time 6 minutes     # of Rest Breaks 0     MPH 2.94     METS 3.35     RPE 11     VO2 Peak 11.73     Symptoms No     Resting HR 67 bpm     Resting BP 150/70     Resting Oxygen Saturation  97 %     Exercise Oxygen Saturation  during 6 min walk 98 %     Max Ex. HR 91 bpm     Max Ex. BP 154/62     2 Minute Post BP 148/66            Oxygen Initial Assessment:   Oxygen Re-Evaluation:   Oxygen Discharge (Final Oxygen Re-Evaluation):   Initial Exercise Prescription:  Initial Exercise Prescription - 02/16/20  1400      Date of Initial Exercise RX and Referring Provider   Date 02/16/20    Referring Provider Dr. Candis Musa    Expected Discharge Date 05/10/20      Treadmill   MPH 2.2    Grade 0    Minutes 17    METs 2.68      NuStep   Level 2    SPM 80    Minutes 22      Prescription Details   Frequency (times per week) 2    Duration Progress to 30 minutes of continuous aerobic without signs/symptoms of physical distress      Intensity   THRR 40-80% of Max Heartrate 62-123    Ratings of Perceived Exertion 11-13    Perceived Dyspnea 0-4      Resistance Training   Training Prescription Yes    Weight 2 lbs    Reps 10-15           Perform Capillary Blood Glucose checks as needed.  Exercise Prescription Changes:   Exercise Prescription Changes    Row Name 02/23/20 1000 02/28/20 1200 03/06/20 1500         Response to Exercise   Blood Pressure  (Admit) -- 138/82 124/72     Blood Pressure (Exercise) -- 174/80 144/60     Blood Pressure (Exit) -- 140/70 136/76     Heart Rate (Admit) -- 74 bpm 71 bpm     Heart Rate (Exercise) -- 132 bpm 132 bpm     Heart Rate (Exit) -- 82 bpm 83 bpm     Rating of Perceived Exertion (Exercise) -- 13 13     Duration -- Continue with 30 min of aerobic exercise without signs/symptoms of physical distress. Continue with 30 min of aerobic exercise without signs/symptoms of physical distress.     Intensity -- THRR unchanged THRR unchanged       Progression   Progression -- Continue to progress workloads to maintain intensity without signs/symptoms of physical distress. Continue to progress workloads to maintain intensity without signs/symptoms of physical distress.       Resistance Training   Training Prescription -- Yes Yes     Weight -- 2 lbs 2 lbs     Reps -- 10-15 10-15     Time -- 10 Minutes 10 Minutes       Treadmill   MPH -- 2.5 2.5     Grade -- 0 0     Minutes -- 17 17     METs -- 2.91 2.91       NuStep   Level -- 3 4     SPM -- 128 123     Minutes -- 22 22     METs -- 4.3 5.4       Home Exercise Plan   Plans to continue exercise at [employer] (comment) -- --     Frequency Add 2 additional days to program exercise sessions. -- --     Initial Home Exercises Provided 02/23/20 -- --            Exercise Comments:   Exercise Comments    Row Name 02/18/20 1215 02/23/20 1005         Exercise Comments Pt completed her first day of exercise in cardiac rehab today. She was able to tolerate exercise well with no complaints. Home exercise reviewed             Exercise Goals and Review:  Exercise Goals    Row Name 02/16/20 1427 03/06/20 1506           Exercise Goals   Increase Physical Activity Yes Yes      Intervention Provide advice, education, support and counseling about physical activity/exercise needs.;Develop an individualized exercise prescription for aerobic  and resistive training based on initial evaluation findings, risk stratification, comorbidities and participant's personal goals. Provide advice, education, support and counseling about physical activity/exercise needs.;Develop an individualized exercise prescription for aerobic and resistive training based on initial evaluation findings, risk stratification, comorbidities and participant's personal goals.      Expected Outcomes Short Term: Attend rehab on a regular basis to increase amount of physical activity.;Long Term: Exercising regularly at least 3-5 days a week.;Long Term: Add in home exercise to make exercise part of routine and to increase amount of physical activity. Short Term: Attend rehab on a regular basis to increase amount of physical activity.;Long Term: Exercising regularly at least 3-5 days a week.;Long Term: Add in home exercise to make exercise part of routine and to increase amount of physical activity.      Increase Strength and Stamina Yes Yes      Intervention Provide advice, education, support and counseling about physical activity/exercise needs.;Develop an individualized exercise prescription for aerobic and resistive training based on initial evaluation findings, risk stratification, comorbidities and participant's personal goals. Provide advice, education, support and counseling about physical activity/exercise needs.;Develop an individualized exercise prescription for aerobic and resistive training based on initial evaluation findings, risk stratification, comorbidities and participant's personal goals.      Expected Outcomes Short Term: Increase workloads from initial exercise prescription for resistance, speed, and METs.;Short Term: Perform resistance training exercises routinely during rehab and add in resistance training at home;Long Term: Improve cardiorespiratory fitness, muscular endurance and strength as measured by increased METs and functional capacity (6MWT) Short Term:  Increase workloads from initial exercise prescription for resistance, speed, and METs.;Short Term: Perform resistance training exercises routinely during rehab and add in resistance training at home;Long Term: Improve cardiorespiratory fitness, muscular endurance and strength as measured by increased METs and functional capacity (6MWT)      Able to understand and use rate of perceived exertion (RPE) scale Yes Yes      Intervention Provide education and explanation on how to use RPE scale Provide education and explanation on how to use RPE scale      Expected Outcomes Short Term: Able to use RPE daily in rehab to express subjective intensity level;Long Term:  Able to use RPE to guide intensity level when exercising independently Short Term: Able to use RPE daily in rehab to express subjective intensity level;Long Term:  Able to use RPE to guide intensity level when exercising independently      Knowledge and understanding of Target Heart Rate Range (THRR) Yes Yes      Intervention Provide education and explanation of THRR including how the numbers were predicted and where they are located for reference Provide education and explanation of THRR including how the numbers were predicted and where they are located for reference      Expected Outcomes Short Term: Able to state/look up THRR;Long Term: Able to use THRR to govern intensity when exercising independently;Short Term: Able to use daily as guideline for intensity in rehab Short Term: Able to state/look up THRR;Long Term: Able to use THRR to govern intensity when exercising independently;Short Term: Able to use daily as guideline for intensity in rehab  Able to check pulse independently Yes Yes      Intervention Provide education and demonstration on how to check pulse in carotid and radial arteries.;Review the importance of being able to check your own pulse for safety during independent exercise Provide education and demonstration on how to check  pulse in carotid and radial arteries.;Review the importance of being able to check your own pulse for safety during independent exercise      Expected Outcomes Short Term: Able to explain why pulse checking is important during independent exercise;Long Term: Able to check pulse independently and accurately Short Term: Able to explain why pulse checking is important during independent exercise;Long Term: Able to check pulse independently and accurately      Understanding of Exercise Prescription Yes Yes      Intervention Provide education, explanation, and written materials on patient's individual exercise prescription Provide education, explanation, and written materials on patient's individual exercise prescription      Expected Outcomes Short Term: Able to explain program exercise prescription;Long Term: Able to explain home exercise prescription to exercise independently Short Term: Able to explain program exercise prescription;Long Term: Able to explain home exercise prescription to exercise independently             Exercise Goals Re-Evaluation :  Exercise Goals Re-Evaluation    [place] Name 03/06/20 1506             Exercise Goal Re-Evaluation   Exercise Goals Review Increase Physical Activity;Increase Strength and Stamina;Able to understand and use rate of perceived exertion (RPE) scale;Knowledge and understanding of Target Heart Rate Range (THRR);Able to check pulse independently;Understanding of Exercise Prescription       Comments Pt has attended 8 exercise sessions. 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 We often have to slow her down for going over her target heart rate. She does aqua aerobics classes multiple times a week on her days away from rehab. She currently exercises at 5.4 METs on the stepper. Will continue to monitor and progress as able.       Expected Outcomes Through exercise at rehab and by engaging in a home exercise plan, the patient will be able to reach their  goals.               Discharge Exercise Prescription (Final Exercise Prescription Changes):  Exercise Prescription Changes - 03/06/20 1500      Response to Exercise   Blood Pressure (Admit) 124/72    Blood Pressure (Exercise) 144/60    Blood Pressure (Exit) 136/76    Heart Rate (Admit) 71 bpm    Heart Rate (Exercise) 132 bpm    Heart Rate (Exit) 83 bpm    Rating of Perceived Exertion (Exercise) 13    Duration Continue with 30 min of aerobic exercise without signs/symptoms of physical distress.    Intensity THRR unchanged      Progression   Progression Continue to progress workloads to maintain intensity without signs/symptoms of physical distress.      Resistance Training   Training Prescription Yes    Weight 2 lbs    Reps 10-15    Time 10 Minutes      Treadmill   MPH 2.5    Grade 0    Minutes 17    METs 2.91      NuStep   Level 4    SPM 123    Minutes 22    METs 5.4  Nutrition:  Target Goals: Understanding of nutrition guidelines, daily intake of sodium 1500mg , cholesterol 200mg , calories 30% from fat and 7% or less from saturated fats, daily to have 5 or more servings of fruits and vegetables.  Biometrics:  Pre Biometrics - 02/16/20 1411      Pre Biometrics   Height 5\' 4"  (1.626 m)    Weight 83.4 kg    Waist Circumference 35.5 inches    Hip Circumference 45.5 inches    Waist to Hip Ratio 0.78 %    BMI (Calculated) 31.54    Triceps Skinfold 20 mm    % Body Fat 39.2 %    Grip Strength 30 kg    Flexibility 30 in    Single Leg Stand 21.93 seconds            Nutrition Therapy Plan and Nutrition Goals:  Nutrition Therapy & Goals - 03/06/20 1344      Personal Nutrition Goals   Comments Will continue to provide education through hand-outs regarding nutrition and healthier choices.      Intervention Plan   Intervention Nutrition handout(s) given to patient.           Nutrition Assessments:  Nutrition Assessments - 02/16/20 1422       MEDFICTS Scores   Pre Score 36           Nutrition Goals Re-Evaluation:   Nutrition Goals Discharge (Final Nutrition Goals Re-Evaluation):   Psychosocial: Target Goals: Acknowledge presence or absence of significant depression and/or stress, maximize coping skills, provide positive support system. Participant is able to verbalize types and ability to use techniques and skills needed for reducing stress and depression.  Initial Review & Psychosocial Screening:  Initial Psych Review & Screening - 02/16/20 1427      Initial Review   Current issues with None Identified      Family Dynamics   Good Support System? Yes    Comments Patient lives with her husband of 14 years. She has children and 2 grandchildren that she is very close to. She denies any depression or anxiety. She says her family is very supportative and she is looking forward to doing the program.      Barriers   Psychosocial barriers to participate in program There are no identifiable barriers or psychosocial needs.      Screening Interventions   Interventions Encouraged to exercise;Provide feedback about the scores to participant           Quality of Life Scores:  Quality of Life - 02/16/20 1405      Quality of Life   Select Quality of Life      Quality of Life Scores   Health/Function Pre 20.8 %    Socioeconomic Pre 25.14 %    Psych/Spiritual Pre 23.14 %    Family Pre 25.2 %    GLOBAL Pre 22.82 %          Scores of 19 and below usually indicate a poorer quality of life in these areas.  A difference of  2-3 points is a clinically meaningful difference.  A difference of 2-3 points in the total score of the Quality of Life Index has been associated with significant improvement in overall quality of life, self-image, physical symptoms, and general health in studies assessing change in quality of life.  PHQ-9: Recent Review Flowsheet Data    Depression screen [facility] 2/9 02/16/2020   Decreased Interest  0   Down, Depressed, Hopeless 0   PHQ -  2 Score 0   Altered sleeping 0   Tired, decreased energy 0   Change in appetite 0   Feeling bad or failure about yourself  0   Trouble concentrating 0   Moving slowly or fidgety/restless 0   Suicidal thoughts 0   PHQ-9 Score 0     Interpretation of Total Score  Total Score Depression Severity:  1-4 = Minimal depression, 5-9 = Mild depression, 10-14 = Moderate depression, 15-19 = Moderately severe depression, 20-27 = Severe depression   Psychosocial Evaluation and Intervention:  Psychosocial Evaluation - 02/16/20 1428      Psychosocial Evaluation & Interventions   Interventions Stress management education;Relaxation education;Encouraged to exercise with the program and follow exercise prescription    Comments Patient's initial PHQ-9 score was 0 and her QOL score was 22.82 overall. She has no psychosocial issues identified at her orientation visit. Will continue to monitor.    Expected Outcomes Patient will have no psychosocial issues identified at discharge.    Continue Psychosocial Services  No Follow up required           Psychosocial Re-Evaluation:   Psychosocial Discharge (Final Psychosocial Re-Evaluation):   Vocational Rehabilitation: Provide vocational rehab assistance to qualifying candidates.   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 She does no need vocational rehab.           Education: Education Goals: Education classes will be provided on a weekly basis, covering required topics. Participant will state understanding/return demonstration of topics presented.  Learning Barriers/Preferences:  Learning Barriers/Preferences -  02/16/20 1422      Learning Barriers/Preferences   Learning Barriers None    Learning Preferences Video;Written Material;Pictoral           Education Topics: Hypertension, Hypertension Reduction -Define heart disease and high blood pressure. Discus how high blood pressure affects the body and ways to reduce high blood pressure.   Exercise and Your Heart -Discuss why it is important to exercise, the FITT principles of exercise, normal and abnormal responses to exercise, and how to exercise safely.   CARDIAC REHAB PHASE II EXERCISE from 02/23/2020 in [place]  Date 02/23/20  Educator DF  Instruction Review Code 2- Demonstrated Understanding      Angina -Discuss definition of angina, causes of angina, treatment of angina, and how to decrease risk of having angina.   CARDIAC REHAB PHASE II EXERCISE from 03/03/2020 in [place]  Date 03/03/20  Educator DF  Instruction Review Code 2- Demonstrated Understanding      Cardiac Medications -Review what the following cardiac medications are used for, how they affect the body, and side effects that may occur when taking the medications.  Medications include Aspirin, Beta blockers, calcium channel blockers, ACE Inhibitors, angiotensin receptor blockers, diuretics, digoxin, and antihyperlipidemics.   Congestive Heart Failure -Discuss the definition of CHF, how to live with CHF, the signs and symptoms of CHF, and how keep track of weight and sodium intake.   Heart Disease and Intimacy -Discus the effect sexual activity has on the heart, how changes occur during intimacy as we age, and safety during sexual activity.   Smoking Cessation / COPD -Discuss different methods to  quit smoking, the health benefits of quitting smoking, and the definition of COPD.   Nutrition I: Fats -Discuss the types of cholesterol, what cholesterol does to the heart, and how cholesterol levels can be  controlled.   Nutrition II: Labels -Discuss the different components of food labels and how to read food label   Heart Parts/Heart Disease and PAD -Discuss the anatomy of the heart, the pathway of blood circulation through the heart, and these are affected by heart disease.   Stress I: Signs and Symptoms -Discuss the causes of stress, how stress may lead to anxiety and depression, and ways to limit stress.   Stress II: Relaxation -Discuss different types of relaxation techniques to limit stress.   Warning Signs of Stroke / TIA -Discuss definition of a stroke, what the signs and symptoms are of a stroke, and how to identify when someone is having stroke.   Knowledge Questionnaire Score:  Knowledge Questionnaire Score - 02/16/20 1423      Knowledge Questionnaire Score   Pre Score 22/24           Core Components/Risk Factors/Patient Goals at Admission:  Personal Goals and Risk Factors at Admission - 02/16/20 1423      Core Components/Risk Factors/Patient Goals on Admission    Weight Management Yes    Intervention Weight Management: Develop a combined nutrition and exercise program designed to reach desired caloric intake, while maintaining appropriate intake of nutrient and fiber, sodium and fats, and appropriate energy expenditure required for the weight goal.;Weight Management: Provide education and appropriate resources to help participant work on and attain dietary goals.;Weight Management/Obesity: Establish reasonable short term and long term weight goals.;Obesity: Provide education and appropriate resources to help participant work on and attain dietary goals.    Admit Weight 183 lb 7.7 oz (83.2 kg)    Goal Weight: Short Term 178 lb 7.7 oz (81 kg)    Goal Weight: Long Term 173 lb (78.5 kg)    Expected Outcomes Weight Loss: Understanding of general recommendations for a balanced deficit meal plan, which promotes 1-2 lb weight loss per week and includes a negative energy  balance of [phone] kcal/d    Personal Goal Other Yes    Personal Goal Lose 10 lbs in the program. Return to doing what she was doing before her event. Be able to know what she can do. Be active again.    Intervention Patient will attend CR 3 days/week and supplement with exercise at home 3 days/week.    Expected Outcomes Patient will meet both program and personal goals.           Core Components/Risk Factors/Patient Goals Review:   Goals and Risk Factor Review    Row Name 03/06/20 1345             Core Components/Risk Factors/Patient Goals Review   Review Patient has completed 9 sessions maintaining her weight since last 30 day review. She is doing very well in the program working very hard with progressions and consistent attendance. Her personal goals are to lose 10 lbs; return to doing what she was before her NSTEMI/Stent placement and to know what she can do. She is making good progress toward meeitng her goals. Will continue to monitor.       Expected Outcomes Patient will complete the program meeting both personal and program goals.              Core Components/Risk Factors/Patient Goals at Discharge (Final Review):   Goals and  Risk Factor Review - 03/06/20 1345      Core Components/Risk Factors/Patient Goals Review   Review Patient has completed 9 sessions maintaining her weight since last 30 day review. She is doing very well in the program working very hard with progressions and consistent attendance. Her personal goals are to lose 10 lbs; return to doing what she was before her NSTEMI/Stent placement and to know what she can do. She is making good progress toward meeitng her goals. Will continue to monitor.    Expected Outcomes Patient will complete the program meeting both personal and program goals.           ITP Comments:   Comments: ITP REVIEW Pt is making expected progress toward Cardiac Rehab goals after completing 9 sessions. Recommend continued exercise,  life style modification, education, and increased stamina and strength.

## 2020-03-08 NOTE — Progress Notes (Signed)
Daily Session Note  Patient Details  Name: Lindsey Mitchell MRN: [id number] Date of Birth: 1955-06-05 Referring Provider:     CARDIAC REHAB PHASE II ORIENTATION from 02/16/2020 in [place]  Referring Provider Dr. Candis Musa      Encounter Date: 03/08/2020  Check In:  Session Check In - 03/08/20 0940      Check-In   Supervising physician immediately available to respond to emergencies [facility] MD immediately available    Physician(s) Dr. Harl Bowie    Location [facility]    Staff Present Aundra Dubin, RN, Bjorn Loser, MS, ACSM-CEP, Exercise Physiologist    Virtual Visit No    Medication changes reported     No    Fall or balance concerns reported    No    Tobacco Cessation No Change    Warm-up and Cool-down Performed as group-led instruction    Resistance Training Performed Yes    VAD Patient? No    PAD/SET Patient? No      Pain Assessment   Currently in Pain? No/denies    Pain Score 0-No pain    Multiple Pain Sites No           Capillary Blood Glucose: No results found for this or any previous visit (from the past 24 hour(s)).    Social History   Tobacco Use  Smoking Status Never Smoker  Smokeless Tobacco Never Used    Goals Met:  Independence with exercise equipment Exercise tolerated well No report of cardiac concerns or symptoms Strength training completed today  Goals Unmet:  Not Applicable  Comments: checkout time is 1030   Dr. Kathie Dike is Medical Director for [facility] Pul[facility] Re[facility].

## 2020-03-09 DIAGNOSIS — M722 Plantar fascial fibromatosis: Secondary | ICD-10-CM | POA: Diagnosis not present

## 2020-03-09 DIAGNOSIS — M79671 Pain in right foot: Secondary | ICD-10-CM | POA: Diagnosis not present

## 2020-03-09 DIAGNOSIS — M7732 Calcaneal spur, left foot: Secondary | ICD-10-CM | POA: Diagnosis not present

## 2020-03-10 ENCOUNTER — Other Ambulatory Visit: Payer: Self-pay

## 2020-03-10 ENCOUNTER — Encounter (HOSPITAL_COMMUNITY)
Admission: RE | Admit: 2020-03-10 | Discharge: 2020-03-10 | Disposition: A | Discharge status: Home / Self Care | Payer: Medicare Other | Source: Ambulatory Visit | Attending: Internal Medicine | Admitting: Internal Medicine

## 2020-03-10 DIAGNOSIS — E559 Vitamin D deficiency, unspecified: Secondary | ICD-10-CM | POA: Diagnosis not present

## 2020-03-10 DIAGNOSIS — I10 Essential (primary) hypertension: Secondary | ICD-10-CM | POA: Diagnosis not present

## 2020-03-10 DIAGNOSIS — I214 Non-ST elevation (NSTEMI) myocardial infarction: Secondary | ICD-10-CM | POA: Diagnosis not present

## 2020-03-10 DIAGNOSIS — Z955 Presence of coronary angioplasty implant and graft: Secondary | ICD-10-CM | POA: Diagnosis not present

## 2020-03-10 DIAGNOSIS — E7849 Other hyperlipidemia: Secondary | ICD-10-CM | POA: Diagnosis not present

## 2020-03-10 NOTE — Progress Notes (Signed)
Daily Session Note  Patient Details  Name: Lindsey Mitchell MRN: [id number] Date of Birth: 08-14-1955 Referring Provider:     CARDIAC REHAB PHASE II ORIENTATION from 02/16/2020 in [place]  Referring Provider Dr. Candis Musa      Encounter Date: 03/10/2020  Check In:  Session Check In - 03/10/20 0937      Check-In   Supervising physician immediately available to respond to emergencies [facility] immediately available    Physician(s) Dr. Harl Bowie    Location [facility]    Staff Present Aundra Dubin, RN, Bjorn Loser, MS, ACSM-CEP, Exercise Physiologist    Virtual Visit No    Medication changes reported     No    Fall or balance concerns reported    No    Tobacco Cessation No Change    Warm-up and Cool-down Performed as group-led instruction    Resistance Training Performed Yes    VAD Patient? No    PAD/SET Patient? No      Pain Assessment   Currently in Pain? No/denies    Pain Score 0-No pain    Multiple Pain Sites No           Capillary Blood Glucose: No results found for this or any previous visit (from the past 24 hour(s)).    Social History   Tobacco Use  Smoking Status Never Smoker  Smokeless Tobacco Never Used    Goals Met:  Independence with exercise equipment Exercise tolerated well No report of cardiac concerns or symptoms Strength training completed today  Goals Unmet:  Not Applicable  Comments: checkout time is 1030   Dr. Kathie Dike is Medical Director for [facility].

## 2020-03-13 ENCOUNTER — Encounter (HOSPITAL_COMMUNITY)
Admission: RE | Admit: 2020-03-13 | Discharge: 2020-03-13 | Disposition: A | Discharge status: Home / Self Care | Payer: Medicare Other | Source: Ambulatory Visit | Attending: Internal Medicine | Admitting: Internal Medicine

## 2020-03-13 ENCOUNTER — Other Ambulatory Visit: Payer: Self-pay

## 2020-03-13 DIAGNOSIS — Z955 Presence of coronary angioplasty implant and graft: Secondary | ICD-10-CM | POA: Diagnosis not present

## 2020-03-13 DIAGNOSIS — I214 Non-ST elevation (NSTEMI) myocardial infarction: Secondary | ICD-10-CM | POA: Diagnosis not present

## 2020-03-13 NOTE — Progress Notes (Signed)
Daily Session Note  Patient Details  Name: Lindsey Mitchell MRN: [id number] Date of Birth: [birth year] Referring Provider:     CARDIAC REHAB PHASE II ORIENTATION from 02/16/2020 in [place]  Referring Provider Dr. Candis Musa      Encounter Date: 03/13/2020  Check In:  Session Check In - 03/13/20 0942      Check-In   Supervising physician immediately available to respond to emergencies [facility] immediately available    Physician(s) Dr. Harl Bowie    Location [facility]    Staff Present Aundra Dubin, RN, Bjorn Loser, MS, ACSM-CEP, Exercise Physiologist    Virtual Visit No    Medication changes reported     No    Fall or balance concerns reported    No    Tobacco Cessation No Change    Warm-up and Cool-down Performed as group-led instruction    Resistance Training Performed Yes    VAD Patient? No    PAD/SET Patient? No      Pain Assessment   Currently in Pain? No/denies    Pain Score 0-No pain    Multiple Pain Sites No           Capillary Blood Glucose: No results found for this or any previous visit (from the past 24 hour(s)).    Social History   Tobacco Use  Smoking Status Never Smoker  Smokeless Tobacco Never Used    Goals Met:  Independence with exercise equipment Exercise tolerated well No report of cardiac concerns or symptoms Strength training completed today  Goals Unmet:  Not Applicable  Comments: checkout time is 1030   Dr. Kathie Dike is Medical Director for [facility] Pul[facility] Rehab.

## 2020-03-15 ENCOUNTER — Encounter (HOSPITAL_COMMUNITY)
Admission: RE | Admit: 2020-03-15 | Discharge: 2020-03-15 | Disposition: A | Discharge status: Home / Self Care | Payer: Medicare Other | Source: Ambulatory Visit | Attending: Internal Medicine | Admitting: Internal Medicine

## 2020-03-15 ENCOUNTER — Other Ambulatory Visit: Payer: Self-pay

## 2020-03-15 DIAGNOSIS — Z955 Presence of coronary angioplasty implant and graft: Secondary | ICD-10-CM | POA: Diagnosis not present

## 2020-03-15 DIAGNOSIS — I214 Non-ST elevation (NSTEMI) myocardial infarction: Secondary | ICD-10-CM

## 2020-03-15 NOTE — Progress Notes (Signed)
Daily Session Note  Patient Details  Name: Lindsey Mitchell MRN: [id number] Date of Birth: Mar 22, 1956 Referring Provider:     CARDIAC REHAB PHASE II ORIENTATION from 02/16/2020 in [place]  Referring Provider Dr. Candis Musa      Encounter Date: 03/15/2020  Check In:  Session Check In - 03/15/20 0942      Check-In   Supervising physician immediately available to respond to emergencies [facility] immediately available    Physician(s) Dr. Domenic Polite    Location [facility]    Staff Present Aundra Dubin, RN, Bjorn Loser, MS, ACSM-CEP, Exercise Physiologist    Virtual Visit No    Medication changes reported     No    Fall or balance concerns reported    No    Tobacco Cessation No Change    Warm-up and Cool-down Performed as group-led instruction    Resistance Training Performed Yes    VAD Patient? No    PAD/SET Patient? No      Pain Assessment   Currently in Pain? No/denies    Pain Score 0-No pain    Multiple Pain Sites No           Capillary Blood Glucose: No results found for this or any previous visit (from the past 24 hour(s)).    Social History   Tobacco Use  Smoking Status Never Smoker  Smokeless Tobacco Never Used    Goals Met:  Independence with exercise equipment Exercise tolerated well No report of cardiac concerns or symptoms Strength training completed today  Goals Unmet:  Not Applicable  Comments: checkout time is 1030   Dr. Kathie Dike is Medical Director for [facility] Re[facility].

## 2020-03-17 ENCOUNTER — Other Ambulatory Visit: Payer: Self-pay

## 2020-03-17 ENCOUNTER — Encounter (HOSPITAL_COMMUNITY)
Admission: RE | Admit: 2020-03-17 | Discharge: 2020-03-17 | Disposition: A | Discharge status: Home / Self Care | Payer: Medicare Other | Source: Ambulatory Visit | Attending: Internal Medicine | Admitting: Internal Medicine

## 2020-03-17 DIAGNOSIS — I214 Non-ST elevation (NSTEMI) myocardial infarction: Secondary | ICD-10-CM | POA: Diagnosis not present

## 2020-03-17 DIAGNOSIS — Z955 Presence of coronary angioplasty implant and graft: Secondary | ICD-10-CM | POA: Diagnosis not present

## 2020-03-17 NOTE — Progress Notes (Signed)
Daily Session Note  Patient Details  Name: Lindsey Mitchell MRN: [id number] Date of Birth: 1955/05/23 Referring Provider:     [place] from 02/16/2020 in [place]  Referring Provider Dr. Candis Musa      Encounter Date: 03/17/2020  Check In:  Session Check In - 03/17/20 1005      Check-In   Supervising physician immediately available to respond to emergencies [facility] immediately available    Physician(s) Dr. Harl Bowie    Location [facility]    Staff Present Aundra Dubin, RN, Bjorn Loser, MS, ACSM-CEP, Exercise Physiologist    Virtual Visit No    Medication changes reported     No    Fall or balance concerns reported    No    Tobacco Cessation No Change    Warm-up and Cool-down Performed as group-led instruction    Resistance Training Performed Yes    VAD Patient? No    PAD/SET Patient? No      Pain Assessment   Currently in Pain? No/denies    Pain Score 0-No pain    Multiple Pain Sites No           Capillary Blood Glucose: No results found for this or any previous visit (from the past 24 hour(s)).    Social History   Tobacco Use  Smoking Status Never Smoker  Smokeless Tobacco Never Used    Goals Met:  Independence with exercise equipment Exercise tolerated well No report of cardiac concerns or symptoms Strength training completed today  Goals Unmet:  Not Applicable  Comments: checkout time is 1030   Dr. Kathie Dike is Medical Director for [facility] Pul[facility] Rehab.

## 2020-03-20 ENCOUNTER — Encounter (HOSPITAL_COMMUNITY)
Admission: RE | Admit: 2020-03-20 | Discharge: 2020-03-20 | Disposition: A | Discharge status: Home / Self Care | Payer: Medicare Other | Source: Ambulatory Visit | Attending: Internal Medicine | Admitting: Internal Medicine

## 2020-03-20 ENCOUNTER — Other Ambulatory Visit: Payer: Self-pay

## 2020-03-20 VITALS — Wt 183.2 lb

## 2020-03-20 DIAGNOSIS — I214 Non-ST elevation (NSTEMI) myocardial infarction: Secondary | ICD-10-CM

## 2020-03-20 DIAGNOSIS — Z955 Presence of coronary angioplasty implant and graft: Secondary | ICD-10-CM | POA: Diagnosis not present

## 2020-03-20 NOTE — Progress Notes (Signed)
Daily Session Note  Patient Details  Name: SUNJAI LEVANDOSKI MRN: [id number] Date of Birth: [birth year] Referring Provider:     [place] from 02/16/2020 in [place]  Referring Provider Dr. Candis Musa      Encounter Date: 03/20/2020  Check In:  Session Check In - 03/20/20 0935      Check-In   Supervising physician immediately available to respond to emergencies [facility] immediately available    Physician(s) Dr. Domenic Polite    Location [facility]    Staff Present Aundra Dubin, RN, Bjorn Loser, MS, ACSM-CEP, Exercise Physiologist    Virtual Visit No    Medication changes reported     No    Fall or balance concerns reported    No    Tobacco Cessation No Change    Warm-up and Cool-down Performed as group-led instruction    Resistance Training Performed Yes    VAD Patient? No    PAD/SET Patient? No      Pain Assessment   Currently in Pain? No/denies    Pain Score 0-No pain    Multiple Pain Sites No           Capillary Blood Glucose: No results found for this or any previous visit (from the past 24 hour(s)).    Social History   Tobacco Use  Smoking Status Never Smoker  Smokeless Tobacco Never Used    Goals Met:  Independence with exercise equipment Exercise tolerated well No report of cardiac concerns or symptoms Strength training completed today  Goals Unmet:  Not Applicable  Comments: checkout time is 1030   Dr. Kathie Dike is Medical Director for [facility] Pul[facility] Rehab.

## 2020-03-21 DIAGNOSIS — E782 Mixed hyperlipidemia: Secondary | ICD-10-CM | POA: Diagnosis not present

## 2020-03-21 DIAGNOSIS — I10 Essential (primary) hypertension: Secondary | ICD-10-CM | POA: Diagnosis not present

## 2020-03-21 DIAGNOSIS — I25118 Atherosclerotic heart disease of native coronary artery with other forms of angina pectoris: Secondary | ICD-10-CM | POA: Diagnosis not present

## 2020-03-22 ENCOUNTER — Other Ambulatory Visit: Payer: Self-pay

## 2020-03-22 ENCOUNTER — Encounter (HOSPITAL_COMMUNITY)
Admission: RE | Admit: 2020-03-22 | Discharge: 2020-03-22 | Disposition: A | Discharge status: Home / Self Care | Payer: Medicare Other | Source: Ambulatory Visit | Attending: Internal Medicine | Admitting: Internal Medicine

## 2020-03-22 DIAGNOSIS — I25119 Atherosclerotic heart disease of native coronary artery with unspecified angina pectoris: Secondary | ICD-10-CM | POA: Diagnosis not present

## 2020-03-22 DIAGNOSIS — I214 Non-ST elevation (NSTEMI) myocardial infarction: Secondary | ICD-10-CM

## 2020-03-22 DIAGNOSIS — I10 Essential (primary) hypertension: Secondary | ICD-10-CM | POA: Diagnosis not present

## 2020-03-22 DIAGNOSIS — Z955 Presence of coronary angioplasty implant and graft: Secondary | ICD-10-CM

## 2020-03-22 DIAGNOSIS — Z299 Encounter for prophylactic measures, unspecified: Secondary | ICD-10-CM | POA: Diagnosis not present

## 2020-03-22 NOTE — Progress Notes (Signed)
Daily Session Note  Patient Details  Name: Lindsey Mitchell MRN: [id number] Date of Birth: 27-Oct-1955 Referring Provider:     CARDIAC REHAB PHASE II ORIENTATION from 02/16/2020 in [place]  Referring Provider Dr. Candis Musa      Encounter Date: 03/22/2020  Check In:   Capillary Blood Glucose: No results found for this or any previous visit (from the past 24 hour(s)).    Social History   Tobacco Use  Smoking Status Never Smoker  Smokeless Tobacco Never Used    Goals Met:  Independence with exercise equipment Exercise tolerated well No report of cardiac concerns or symptoms Strength training completed today  Goals Unmet:  Not Applicable  Comments: check out 1030    Dr. Kathie Dike is Medical Director for [facility] Pulmonary Rehab.

## 2020-03-24 ENCOUNTER — Encounter (HOSPITAL_COMMUNITY)
Admission: RE | Admit: 2020-03-24 | Discharge: 2020-03-24 | Disposition: A | Discharge status: Home / Self Care | Payer: Medicare Other | Source: Ambulatory Visit | Attending: Internal Medicine | Admitting: Internal Medicine

## 2020-03-24 ENCOUNTER — Other Ambulatory Visit: Payer: Self-pay

## 2020-03-24 DIAGNOSIS — Z955 Presence of coronary angioplasty implant and graft: Secondary | ICD-10-CM

## 2020-03-24 DIAGNOSIS — I214 Non-ST elevation (NSTEMI) myocardial infarction: Secondary | ICD-10-CM

## 2020-03-24 NOTE — Progress Notes (Signed)
Daily Session Note  Patient Details  Name: DOT SPLINTER MRN: [id number] Date of Birth: 06/13/1955 Referring Provider:     [place] from 02/16/2020 in [place]  Referring Provider Dr. Candis Musa      Encounter Date: 03/24/2020  Check In:  Session Check In - 03/24/20 0941      Check-In   Supervising physician immediately available to respond to emergencies [facility] immediately available    Physician(s) Dr. Domenic Polite    Location [facility]    Staff Present Aundra Dubin, RN, Bjorn Loser, MS, ACSM-CEP, Exercise Physiologist;[place] Audria Nine, MS, Exercise Physiologist    Virtual Visit No    Medication changes reported     No    Fall or balance concerns reported    No    Tobacco Cessation No Change    Warm-up and Cool-down Performed as group-led instruction    Resistance Training Performed Yes    VAD Patient? No    PAD/SET Patient? No      Pain Assessment   Currently in Pain? No/denies    Pain Score 0-No pain    Multiple Pain Sites No           Capillary Blood Glucose: No results found for this or any previous visit (from the past 24 hour(s)).    Social History   Tobacco Use  Smoking Status Never Smoker  Smokeless Tobacco Never Used    Goals Met:  Independence with exercise equipment Exercise tolerated well No report of cardiac concerns or symptoms Strength training completed today  Goals Unmet:  N/A  Comments: checkout time is 1030   Dr. Kathie Dike is Medical Director for [facility] Pulmonary Rehab.

## 2020-03-27 ENCOUNTER — Encounter (HOSPITAL_COMMUNITY)
Admission: RE | Admit: 2020-03-27 | Discharge: 2020-03-27 | Disposition: A | Discharge status: Home / Self Care | Payer: Medicare Other | Source: Ambulatory Visit | Attending: Internal Medicine | Admitting: Internal Medicine

## 2020-03-27 ENCOUNTER — Other Ambulatory Visit: Payer: Self-pay

## 2020-03-27 VITALS — Wt 183.9 lb

## 2020-03-27 DIAGNOSIS — Z955 Presence of coronary angioplasty implant and graft: Secondary | ICD-10-CM

## 2020-03-27 DIAGNOSIS — I214 Non-ST elevation (NSTEMI) myocardial infarction: Secondary | ICD-10-CM | POA: Diagnosis not present

## 2020-03-27 NOTE — Progress Notes (Signed)
Daily Session Note  Patient Details  Name: MARKETA MIDKIFF MRN: [id number] Date of Birth: Dec 25, 1955 Referring Provider:     [place] from 02/16/2020 in [place]  Referring Provider Dr. Candis Musa      Encounter Date: 03/27/2020  Check In:  Session Check In - 03/27/20 1003      Check-In   Supervising physician immediately available to respond to emergencies [facility] MD immediately available    Physician(s) Dr. Domenic Polite    Location [facility]    Staff Present Cathren Harsh, MS, Exercise Physiologist;Loza Prell Kris Mouton, MS, ACSM-CEP, Exercise Physiologist    Virtual Visit No    Medication changes reported     No    Fall or balance concerns reported    No    Tobacco Cessation No Change    Warm-up and Cool-down Performed as group-led instruction    Resistance Training Performed Yes    VAD Patient? No    PAD/SET Patient? No      Pain Assessment   Currently in Pain? No/denies    Pain Score 0-No pain    Multiple Pain Sites No           Capillary Blood Glucose: No results found for this or any previous visit (from the past 24 hour(s)).    Social History   Tobacco Use  Smoking Status Never Smoker  Smokeless Tobacco Never Used    Goals Met:  Independence with exercise equipment Exercise tolerated well No report of cardiac concerns or symptoms Strength training completed today  Goals Unmet:  Not Applicable  Comments: check out 1030    Dr. Kathie Dike is Medical Director for [facility] Pulmonary Rehab.

## 2020-03-29 ENCOUNTER — Encounter (HOSPITAL_COMMUNITY)
Admission: RE | Admit: 2020-03-29 | Discharge: 2020-03-29 | Disposition: A | Discharge status: Home / Self Care | Payer: Medicare Other | Source: Ambulatory Visit | Attending: Internal Medicine | Admitting: Internal Medicine

## 2020-03-29 ENCOUNTER — Other Ambulatory Visit: Payer: Self-pay

## 2020-03-29 DIAGNOSIS — Z299 Encounter for prophylactic measures, unspecified: Secondary | ICD-10-CM | POA: Diagnosis not present

## 2020-03-29 DIAGNOSIS — I10 Essential (primary) hypertension: Secondary | ICD-10-CM | POA: Diagnosis not present

## 2020-03-29 DIAGNOSIS — N390 Urinary tract infection, site not specified: Secondary | ICD-10-CM | POA: Diagnosis not present

## 2020-03-29 DIAGNOSIS — Z955 Presence of coronary angioplasty implant and graft: Secondary | ICD-10-CM | POA: Diagnosis not present

## 2020-03-29 DIAGNOSIS — I25119 Atherosclerotic heart disease of native coronary artery with unspecified angina pectoris: Secondary | ICD-10-CM | POA: Diagnosis not present

## 2020-03-29 DIAGNOSIS — R350 Frequency of micturition: Secondary | ICD-10-CM | POA: Diagnosis not present

## 2020-03-29 DIAGNOSIS — I214 Non-ST elevation (NSTEMI) myocardial infarction: Secondary | ICD-10-CM | POA: Diagnosis not present

## 2020-03-29 NOTE — Progress Notes (Signed)
Daily Session Note  Patient Details  Name: Lindsey Mitchell MRN: [id number] Date of Birth: 09-30-1955 Referring Provider:     [place] from 02/16/2020 in [place]  Referring Provider Dr. Candis Musa      Encounter Date: 03/29/2020  Check In:  Session Check In - 03/29/20 0930      Check-In   Supervising physician immediately available to respond to emergencies [facility] MD immediately available    Physician(s) Dr. Domenic Polite    Location [facility]    Staff Present Aundra Dubin, RN, Bjorn Loser, MS, ACSM-CEP, Exercise Physiologist    Virtual Visit No    Medication changes reported     No    Fall or balance concerns reported    No    Tobacco Cessation No Change    Warm-up and Cool-down Performed as group-led instruction    Resistance Training Performed Yes    VAD Patient? No    PAD/SET Patient? No      Pain Assessment   Currently in Pain? No/denies    Pain Score 0-No pain    Multiple Pain Sites No           Capillary Blood Glucose: No results found for this or any previous visit (from the past 24 hour(s)).    Social History   Tobacco Use  Smoking Status Never Smoker  Smokeless Tobacco Never Used    Goals Met:  Independence with exercise equipment Exercise tolerated well No report of cardiac concerns or symptoms Strength training completed today  Goals Unmet:  Not Applicable  Comments: checkout time is 1030   Dr. Kathie Dike is Medical Director for [facility] Pulmonary Rehab.

## 2020-03-29 NOTE — Progress Notes (Signed)
Cardiac Individual Treatment Plan  Patient Details  Name: Lindsey Mitchell MRN: [id number] Date of Birth: 07-Jun-1955 Referring Provider:     CARDIAC REHAB PHASE II ORIENTATION from 02/16/2020 in [place]  Referring Provider Dr. Candis Musa      Initial Encounter Date:    CARDIAC REHAB PHASE II ORIENTATION from 02/16/2020 in [place]  Date 02/16/20      Visit Diagnosis: NSTEMI (non-ST elevated myocardial infarction) ([place])  S/P coronary artery stent placement  Patient's Home Medications on Admission:  Current Outpatient Medications:  .  aspirin EC 81 MG tablet, Take 81 mg by mouth daily. Swallow whole., Disp: , Rfl:  .  atorvastatin (LIPITOR) 80 MG tablet, Take 80 mg by mouth daily., Disp: , Rfl:  .  candesartan (ATACAND) 16 MG tablet, Take 16 mg by mouth daily., Disp: , Rfl:  .  candesartan-hydrochlorothiazide (ATACAND HCT) 16-12.5 MG tablet, Take 1 tablet by mouth daily. (Patient not taking: Reported on 02/16/2020), Disp: , Rfl:  .  cholecalciferol (VITAMIN D3) 25 MCG (1000 UNIT) tablet, Take 2,000 Units by mouth daily., Disp: , Rfl:  .  metoprolol succinate (TOPROL-XL) 25 MG 24 hr tablet, Take 25 mg by mouth daily., Disp: , Rfl:  .  ticagrelor (BRILINTA) 90 MG TABS tablet, Take 90 mg by mouth 2 (two) times daily., Disp: , Rfl:   Past Medical History: Past Medical History:  Diagnosis Date  . Diverticulitis   . Hypertension     Tobacco Use: Social History   Tobacco Use  Smoking Status Never Smoker  Smokeless Tobacco Never Used    Labs: Recent Review Scientist, physiological    Labs for ITP Cardiac and Pulmonary Rehab Latest Ref Rng & Units 12/04/2018   Cholestrol 100 - 199 mg/dL 223(H)   LDLCALC 0 - 99 mg/dL 127(H)   HDL >39 mg/dL 82   Trlycerides 0 - 149 mg/dL 71      Capillary Blood Glucose: No results found for: GLUCAP   Exercise Target Goals: Exercise Program Goal: Individual exercise prescription set using results from  initial 6 min walk test and THRR while considering  patient's activity barriers and safety.   Exercise Prescription Goal: Starting with aerobic activity 30 plus minutes a day, 3 days per week for initial exercise prescription. Provide home exercise prescription and guidelines that participant acknowledges understanding prior to discharge.  Activity Barriers & Risk Stratification:   6 Minute Walk:  6 Minute Walk    Row Name 02/16/20 1406         6 Minute Walk   Phase Initial     Distance 1550 feet     Walk Time 6 minutes     # of Rest Breaks 0     MPH 2.94     METS 3.35     RPE 11     VO2 Peak 11.73     Symptoms No     Resting HR 67 bpm     Resting BP 150/70     Resting Oxygen Saturation  97 %     Exercise Oxygen Saturation  during 6 min walk 98 %     Max Ex. HR 91 bpm     Max Ex. BP 154/62     2 Minute Post BP 148/66            Oxygen Initial Assessment:   Oxygen Re-Evaluation:   Oxygen Discharge (Final Oxygen Re-Evaluation):   Initial Exercise Prescription:  Initial Exercise Prescription - 02/16/20  1400      Date of Initial Exercise RX and Referring Provider   Date 02/16/20    Referring Provider Dr. Candis Musa    Expected Discharge Date 05/10/20      Treadmill   MPH 2.2    Grade 0    Minutes 17    METs 2.68      NuStep   Level 2    SPM 80    Minutes 22      Prescription Details   Frequency (times per week) 2    Duration Progress to 30 minutes of continuous aerobic without signs/symptoms of physical distress      Intensity   THRR 40-80% of Max Heartrate 62-123    Ratings of Perceived Exertion 11-13    Perceived Dyspnea 0-4      Resistance Training   Training Prescription Yes    Weight 2 lbs    Reps 10-15           Perform Capillary Blood Glucose checks as needed.  Exercise Prescription Changes:   Exercise Prescription Changes    Row Name 02/23/20 1000 02/28/20 1200 03/06/20 1500 03/20/20 1000 03/27/20 1300     Response to Exercise    Blood Pressure (Admit) -- 138/82 124/72 148/72 140/64   Blood Pressure (Exercise) -- 174/80 144/60 174/60 186/72   Blood Pressure (Exit) -- 140/70 136/76 130/64 122/50   Heart Rate (Admit) -- 74 bpm 71 bpm 76 bpm 76 bpm   Heart Rate (Exercise) -- 132 bpm 132 bpm 730 bpm 120 bpm   Heart Rate (Exit) -- 82 bpm 83 bpm 83 bpm 82 bpm   Rating of Perceived Exertion (Exercise) -- 13 13 14 13    Duration -- Continue with 30 min of aerobic exercise without signs/symptoms of physical distress. Continue with 30 min of aerobic exercise without signs/symptoms of physical distress. Continue with 30 min of aerobic exercise without signs/symptoms of physical distress. Continue with 30 min of aerobic exercise without signs/symptoms of physical distress.   Intensity -- THRR unchanged THRR unchanged THRR unchanged THRR unchanged     Progression   Progression -- Continue to progress workloads to maintain intensity without signs/symptoms of physical distress. Continue to progress workloads to maintain intensity without signs/symptoms of physical distress. Continue to progress workloads to maintain intensity without signs/symptoms of physical distress. Continue to progress workloads to maintain intensity without signs/symptoms of physical distress.     Resistance Training   Training Prescription -- Yes Yes Yes Yes   Weight -- 2 lbs 2 lbs 3 lbs 3 lbs   Reps -- 10-15 10-15 10-15 10-15   Time -- 10 Minutes 10 Minutes 10 Minutes 10 Minutes     Treadmill   MPH -- 2.5 2.5 2.7 2.7   Grade -- 0 0 0 0   Minutes -- 17 17 17 22    METs -- 2.91 2.91 3.07 3.07     NuStep   Level -- 3 4 5 5    SPM -- 128 123 110 108   Minutes -- 22 22 22 17    METs -- 4.3 5.4 3.9 4.4     Home Exercise Plan   Plans to continue exercise at [employer] (comment) -- -- -- --   Frequency Add 2 additional days to program exercise sessions. -- -- -- --   Initial Home Exercises Provided 02/23/20 -- -- -- --          Exercise  Comments:   Exercise Comments    Row  Name 02/18/20 1215 02/23/20 1005         Exercise Comments Pt completed her first day of exercise in cardiac rehab today. She was able to tolerate exercise well with no complaints. Home exercise reviewed             Exercise Goals and Review:   Exercise Goals    Row Name 02/16/20 1427 03/06/20 1506           Exercise Goals   Increase Physical Activity Yes Yes      Intervention Provide advice, education, support and counseling about physical activity/exercise needs.;Develop an individualized exercise prescription for aerobic and resistive training based on initial evaluation findings, risk stratification, comorbidities and participant's personal goals. Provide advice, education, support and counseling about physical activity/exercise needs.;Develop an individualized exercise prescription for aerobic and resistive training based on initial evaluation findings, risk stratification, comorbidities and participant's personal goals.      Expected Outcomes Short Term: Attend rehab on a regular basis to increase amount of physical activity.;Long Term: Exercising regularly at least 3-5 days a week.;Long Term: Add in home exercise to make exercise part of routine and to increase amount of physical activity. Short Term: Attend rehab on a regular basis to increase amount of physical activity.;Long Term: Exercising regularly at least 3-5 days a week.;Long Term: Add in home exercise to make exercise part of routine and to increase amount of physical activity.      Increase Strength and Stamina Yes Yes      Intervention Provide advice, education, support and counseling about physical activity/exercise needs.;Develop an individualized exercise prescription for aerobic and resistive training based on initial evaluation findings, risk stratification, comorbidities and participant's personal goals. Provide advice, education, support and counseling about physical  activity/exercise needs.;Develop an individualized exercise prescription for aerobic and resistive training based on initial evaluation findings, risk stratification, comorbidities and participant's personal goals.      Expected Outcomes Short Term: Increase workloads from initial exercise prescription for resistance, speed, and METs.;Short Term: Perform resistance training exercises routinely during rehab and add in resistance training at home;Long Term: Improve cardiorespiratory fitness, muscular endurance and strength as measured by increased METs and functional capacity (6MWT) Short Term: Increase workloads from initial exercise prescription for resistance, speed, and METs.;Short Term: Perform resistance training exercises routinely during rehab and add in resistance training at home;Long Term: Improve cardiorespiratory fitness, muscular endurance and strength as measured by increased METs and functional capacity (6MWT)      Able to understand and use rate of perceived exertion (RPE) scale Yes Yes      Intervention Provide education and explanation on how to use RPE scale Provide education and explanation on how to use RPE scale      Expected Outcomes Short Term: Able to use RPE daily in rehab to express subjective intensity level;Long Term:  Able to use RPE to guide intensity level when exercising independently Short Term: Able to use RPE daily in rehab to express subjective intensity level;Long Term:  Able to use RPE to guide intensity level when exercising independently      Knowledge and understanding of Target Heart Rate Range (THRR) Yes Yes      Intervention Provide education and explanation of THRR including how the numbers were predicted and where they are located for reference Provide education and explanation of THRR including how the numbers were predicted and where they are located for reference      Expected Outcomes Short Term: Able to state/look up THRR;Long  Term: Able to use THRR to govern  intensity when exercising independently;Short Term: Able to use daily as guideline for intensity in rehab Short Term: Able to state/look up THRR;Long Term: Able to use THRR to govern intensity when exercising independently;Short Term: Able to use daily as guideline for intensity in rehab      Able to check pulse independently Yes Yes      Intervention Provide education and demonstration on how to check pulse in carotid and radial arteries.;Review the importance of being able to check your own pulse for safety during independent exercise Provide education and demonstration on how to check pulse in carotid and radial arteries.;Review the importance of being able to check your own pulse for safety during independent exercise      Expected Outcomes Short Term: Able to explain why pulse checking is important during independent exercise;Long Term: Able to check pulse independently and accurately Short Term: Able to explain why pulse checking is important during independent exercise;Long Term: Able to check pulse independently and accurately      Understanding of Exercise Prescription Yes Yes      Intervention Provide education, explanation, and written materials on patient's individual exercise prescription Provide education, explanation, and written materials on patient's individual exercise prescription      Expected Outcomes Short Term: Able to explain program exercise prescription;Long Term: Able to explain home exercise prescription to exercise independently Short Term: Able to explain program exercise prescription;Long Term: Able to explain home exercise prescription to exercise independently             Exercise Goals Re-Evaluation :  Exercise Goals Re-Evaluation    [place] Name 03/06/20 1506             Exercise Goal Re-Evaluation   Exercise Goals Review Increase Physical Activity;Increase Strength and Stamina;Able to understand and use rate of perceived exertion (RPE) scale;Knowledge and  understanding of Target Heart Rate Range (THRR);Able to check pulse independently;Understanding of Exercise Prescription       Comments Pt has attended 8 exercise sessions. 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 We often have to slow her down for going over her target heart rate. She does aqua aerobics classes multiple times a week on her days away from rehab. She currently exercises at 5.4 METs on the stepper. Will continue to monitor and progress as able.       Expected Outcomes Through exercise at rehab and by engaging in a home exercise plan, the patient will be able to reach their goals.               Discharge Exercise Prescription (Final Exercise Prescription Changes):  Exercise Prescription Changes - 03/27/20 1300      Response to Exercise   Blood Pressure (Admit) 140/64    Blood Pressure (Exercise) 186/72    Blood Pressure (Exit) 122/50    Heart Rate (Admit) 76 bpm    Heart Rate (Exercise) 120 bpm    Heart Rate (Exit) 82 bpm    Rating of Perceived Exertion (Exercise) 13    Duration Continue with 30 min of aerobic exercise without signs/symptoms of physical distress.    Intensity THRR unchanged      Progression   Progression Continue to progress workloads to maintain intensity without signs/symptoms of physical distress.      Resistance Training   Training Prescription Yes    Weight 3 lbs    Reps 10-15    Time 10 Minutes  Treadmill   MPH 2.7    Grade 0    Minutes 22    METs 3.07      NuStep   Level 5    SPM 108    Minutes 17    METs 4.4           Nutrition:  Target Goals: Understanding of nutrition guidelines, daily intake of sodium 1500mg , cholesterol 200mg , calories 30% from fat and 7% or less from saturated fats, daily to have 5 or more servings of fruits and vegetables.  Biometrics:  Pre Biometrics - 02/16/20 1411      Pre Biometrics   Height 5\' 4"  (1.626 m)    Weight 83.4 kg    Waist Circumference 35.5 inches    Hip Circumference  45.5 inches    Waist to Hip Ratio 0.78 %    BMI (Calculated) 31.54    Triceps Skinfold 20 mm    % Body Fat 39.2 %    Grip Strength 30 kg    Flexibility 30 in    Single Leg Stand 21.93 seconds            Nutrition Therapy Plan and Nutrition Goals:  Nutrition Therapy & Goals - 03/27/20 0844      Personal Nutrition Goals   Comments Will continue to provide education through hand-outs regarding nutrition and healthier choices.      Intervention Plan   Intervention Nutrition handout(s) given to patient.           Nutrition Assessments:  Nutrition Assessments - 02/16/20 1422      MEDFICTS Scores   Pre Score 36          MEDIFICTS Score Key:  ?70 Need to make dietary changes   40-70 Heart Healthy Diet  ? 40 Therapeutic Level Cholesterol Diet   Picture Your Plate Scores:  <29 Unhealthy dietary pattern with much room for improvement.  41-50 Dietary pattern unlikely to meet recommendations for good health and room for improvement.  51-60 More healthful dietary pattern, with some room for improvement.   >60 Healthy dietary pattern, although there may be some specific behaviors that could be improved.    Nutrition Goals Re-Evaluation:   Nutrition Goals Discharge (Final Nutrition Goals Re-Evaluation):   Psychosocial: Target Goals: Acknowledge presence or absence of significant depression and/or stress, maximize coping skills, provide positive support system. Participant is able to verbalize types and ability to use techniques and skills needed for reducing stress and depression.  Initial Review & Psychosocial Screening:  Initial Psych Review & Screening - 02/16/20 1427      Initial Review   Current issues with None Identified      Family Dynamics   Good Support System? Yes    Comments Patient lives with her husband of 14 years. She has children and 2 grandchildren that she is very close to. She denies any depression or anxiety. She says her family is very  supportative and she is looking forward to doing the program.      Barriers   Psychosocial barriers to participate in program There are no identifiable barriers or psychosocial needs.      Screening Interventions   Interventions Encouraged to exercise;Provide feedback about the scores to participant           Quality of Life Scores:  Quality of Life - 02/16/20 1405      Quality of Life   Select Quality of Life      Quality of Life Scores  Health/Function Pre 20.8 %    Socioeconomic Pre 25.14 %    Psych/Spiritual Pre 23.14 %    Family Pre 25.2 %    GLOBAL Pre 22.82 %          Scores of 19 and below usually indicate a poorer quality of life in these areas.  A difference of  2-3 points is a clinically meaningful difference.  A difference of 2-3 points in the total score of the Quality of Life Index has been associated with significant improvement in overall quality of life, self-image, physical symptoms, and general health in studies assessing change in quality of life.  PHQ-9: Recent Review Flowsheet Data    Depression screen [facility] 2/9 02/16/2020   Decreased Interest 0   Down, Depressed, Hopeless 0   PHQ - 2 Score 0   Altered sleeping 0   Tired, decreased energy 0   Change in appetite 0   Feeling bad or failure about yourself  0   Trouble concentrating 0   Moving slowly or fidgety/restless 0   Suicidal thoughts 0   PHQ-9 Score 0     Interpretation of Total Score  Total Score Depression Severity:  1-4 = Minimal depression, 5-9 = Mild depression, 10-14 = Moderate depression, 15-19 = Moderately severe depression, 20-27 = Severe depression   Psychosocial Evaluation and Intervention:  Psychosocial Evaluation - 02/16/20 1428      Psychosocial Evaluation & Interventions   Interventions Stress management education;Relaxation education;Encouraged to exercise with the program and follow exercise prescription    Comments Patient's initial PHQ-9 score was 0 and her QOL score  was 22.82 overall. She has no psychosocial issues identified at her orientation visit. Will continue to monitor.    Expected Outcomes Patient will have no psychosocial issues identified at discharge.    Continue Psychosocial Services  No Follow up required           Psychosocial Re-Evaluation:  Psychosocial Re-Evaluation    [place] Name 03/27/20 [phone]             Psychosocial Re-Evaluation   Current issues with None Identified       Comments Patient continues to have no psychosocial issues identfied. She works really hard during her sessions and seems to enjoy coming to the program. She says she feels better and is now feels confident about what she is able to do. Will continue to monitor.       Expected Outcomes Patient will have no psychosocial issues identfiied at discharge.       Interventions Stress management education;Encouraged to attend Cardiac Rehabilitation for the exercise;Relaxation education       Continue Psychosocial Services  No Follow up required              Psychosocial Discharge (Final Psychosocial Re-Evaluation):  Psychosocial Re-Evaluation - 03/27/20 0844      Psychosocial Re-Evaluation   Current issues with None Identified    Comments Patient continues to have no psychosocial issues identfied. She works really hard during her sessions and seems to enjoy coming to the program. She says she feels better and is now feels confident about what she is able to do. Will continue to monitor.    Expected Outcomes Patient will have no psychosocial issues identfiied at discharge.    Interventions Stress management education;Encouraged to attend Cardiac Rehabilitation for the exercise;Relaxation education    Continue Psychosocial Services  No Follow up required           Vocational Rehabilitation:  Provide vocational rehab assistance to qualifying candidates.   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 She does no need vocational rehab.           Education: Education Goals: Education classes will be provided on a weekly basis, covering required topics. Participant will state understanding/return demonstration of topics presented.  Learning Barriers/Preferences:  Learning Barriers/Preferences - 02/16/20 1422      Learning Barriers/Preferences   Learning Barriers None    Learning Preferences Video;Written Material;Pictoral           Education Topics: Hypertension, Hypertension Reduction -Define heart disease and high blood pressure. Discus how high blood pressure affects the body and ways to reduce high blood pressure.   Exercise and Your Heart -Discuss why it is important to exercise, the FITT principles of exercise, normal and abnormal responses to exercise, and how to exercise safely.   CARDIAC REHAB PHASE II EXERCISE from 02/23/2020 in [place]  Date 02/23/20  Educator DF  Instruction Review Code 2- Demonstrated Understanding      Angina -Discuss definition of angina, causes of angina, treatment of angina, and how to decrease risk of having angina.   CARDIAC REHAB PHASE II EXERCISE from 03/22/2020 in [place]  Date 03/03/20  Educator DF  Instruction Review Code 2- Demonstrated Understanding      Cardiac Medications -Review what the following cardiac medications are used for, how they affect the body, and side effects that may occur when taking the medications.  Medications include Aspirin, Beta blockers, calcium channel blockers, ACE Inhibitors, angiotensin receptor blockers, diuretics, digoxin, and antihyperlipidemics.   CARDIAC REHAB PHASE II EXERCISE from 03/22/2020 in [place]  Date 03/08/20  Educator DF  Instruction Review Code 2- Demonstrated Understanding      Congestive Heart Failure -Discuss the definition of CHF, how to live with CHF, the signs and symptoms of CHF, and how keep track of weight and sodium intake.   CARDIAC REHAB PHASE II EXERCISE from 03/22/2020 in [place]  Date 03/15/20  Educator DF  Instruction Review Code 2- Demonstrated Understanding      Heart Disease and Intimacy -Discus the effect sexual activity has on the heart, how changes occur during intimacy as we age, and safety during sexual activity.   CARDIAC REHAB PHASE II EXERCISE from 03/22/2020 in [place]  Date 03/22/20  Educator DF  Instruction Review Code 2- Demonstrated Understanding      Smoking Cessation / COPD -Discuss different methods to quit smoking, the health benefits of quitting smoking, and the definition of COPD.   Nutrition I: Fats -Discuss the types of cholesterol, what cholesterol does to the heart, and how cholesterol levels can be controlled.   Nutrition II: Labels -Discuss the different components of food labels and how to read food label   Heart Parts/Heart Disease and PAD -Discuss the anatomy of the heart, the pathway of blood circulation through the heart, and these are affected by heart disease.   Stress I: Signs and Symptoms -Discuss the causes of stress, how stress may lead to anxiety and depression, and ways to limit stress.   Stress II: Relaxation -  Discuss different types of relaxation techniques to limit stress.   Warning Signs of Stroke / TIA -Discuss definition of a stroke, what the signs and symptoms are of a stroke, and how to identify when someone is having stroke.   Knowledge Questionnaire Score:  Knowledge Questionnaire Score - 02/16/20 1423      Knowledge Questionnaire Score   Pre Score 22/24           Core Components/Risk Factors/Patient  Goals at Admission:  Personal Goals and Risk Factors at Admission - 02/16/20 1423      Core Components/Risk Factors/Patient Goals on Admission    Weight Management Yes    Intervention Weight Management: Develop a combined nutrition and exercise program designed to reach desired caloric intake, while maintaining appropriate intake of nutrient and fiber, sodium and fats, and appropriate energy expenditure required for the weight goal.;Weight Management: Provide education and appropriate resources to help participant work on and attain dietary goals.;Weight Management/Obesity: Establish reasonable short term and long term weight goals.;Obesity: Provide education and appropriate resources to help participant work on and attain dietary goals.    Admit Weight 183 lb 7.7 oz (83.2 kg)    Goal Weight: Short Term 178 lb 7.7 oz (81 kg)    Goal Weight: Long Term 173 lb (78.5 kg)    Expected Outcomes Weight Loss: Understanding of general recommendations for a balanced deficit meal plan, which promotes 1-2 lb weight loss per week and includes a negative energy balance of [phone] kcal/d    Personal Goal Other Yes    Personal Goal Lose 10 lbs in the program. Return to doing what she was doing before her event. Be able to know what she can do. Be active again.    Intervention Patient will attend CR 3 days/week and supplement with exercise at home 3 days/week.    Expected Outcomes Patient will meet both program and personal goals.           Core Components/Risk Factors/Patient Goals Review:   Goals and Risk Factor Review    Row Name 03/06/20 1345 03/27/20 0845           Core Components/Risk Factors/Patient Goals Review   Review Patient has completed 9 sessions maintaining her weight since last 30 day review. She is doing very well in the program working very hard with progressions and consistent attendance. Her personal goals are to lose 10 lbs; return to doing what she was before her NSTEMI/Stent  placement and to know what she can do. She is making good progress toward meeitng her goals. Will continue to monitor. Patient has had 17 sessions losing 1 lb since last 30 day review. She continues to do very well in the program with progression and consistent attendance. Her blood pressure is at time elevated. She says she does not know where her cardiologist wants her b/p. We plan to send a progress reprots with her to her next appointment. Her personal goals are to lose 10 lbs; return to doing what she was before; know what she can do. Will continue to monitor for progress.      Expected Outcomes Patient will complete the program meeting both personal and program goals. Patient will complete the program meeting both personal and program goals.             Core Components/Risk Factors/Patient Goals at Discharge (Final Review):   Goals and Risk Factor Review - 03/27/20 0845      Core Components/Risk Factors/Patient Goals Review  Review Patient has had 17 sessions losing 1 lb since last 30 day review. She continues to do very well in the program with progression and consistent attendance. Her blood pressure is at time elevated. She says she does not know where her cardiologist wants her b/p. We plan to send a progress reprots with her to her next appointment. Her personal goals are to lose 10 lbs; return to doing what she was before; know what she can do. Will continue to monitor for progress.    Expected Outcomes Patient will complete the program meeting both personal and program goals.           ITP Comments:   Comments: ITP REVIEW Pt is making expected progress toward Cardiac Rehab goals after completing 18 sessions. Recommend continued exercise, life style modification, education, and increased stamina and strength.

## 2020-03-31 ENCOUNTER — Encounter (HOSPITAL_COMMUNITY)
Admission: RE | Admit: 2020-03-31 | Discharge: 2020-03-31 | Disposition: A | Discharge status: Home / Self Care | Payer: Medicare Other | Source: Ambulatory Visit | Attending: Internal Medicine | Admitting: Internal Medicine

## 2020-03-31 ENCOUNTER — Other Ambulatory Visit: Payer: Self-pay

## 2020-03-31 DIAGNOSIS — Z955 Presence of coronary angioplasty implant and graft: Secondary | ICD-10-CM

## 2020-03-31 DIAGNOSIS — I214 Non-ST elevation (NSTEMI) myocardial infarction: Secondary | ICD-10-CM

## 2020-03-31 NOTE — Progress Notes (Signed)
Daily Session Note  Patient Details  Name: Lindsey Mitchell MRN: [id number] Date of Birth: 21-Sep-1955 Referring Provider:     CARDIAC REHAB PHASE II ORIENTATION from 02/16/2020 in [place]  Referring Provider Dr. Candis Musa      Encounter Date: 03/31/2020  Check In:  Session Check In - 03/31/20 0930      Check-In   Supervising physician immediately available to respond to emergencies [facility] MD immediately available    Physician(s) Dr. Harl Bowie    Location [facility]    Staff Present Aundra Dubin, RN, Bjorn Loser, MS, ACSM-CEP, Exercise Physiologist    Virtual Visit No    Medication changes reported     No    Fall or balance concerns reported    No    Tobacco Cessation No Change    Warm-up and Cool-down Performed as group-led instruction    Resistance Training Performed Yes    VAD Patient? No    PAD/SET Patient? No      Pain Assessment   Currently in Pain? No/denies    Pain Score 0-No pain    Multiple Pain Sites No           Capillary Blood Glucose: No results found for this or any previous visit (from the past 24 hour(s)).    Social History   Tobacco Use  Smoking Status Never Smoker  Smokeless Tobacco Never Used    Goals Met:  Independence with exercise equipment Exercise tolerated well No report of cardiac concerns or symptoms Strength training completed today  Goals Unmet:  Not Applicable  Comments: checkout time is 1030   Dr. Kathie Dike is Medical Director for [facility] Pulmonary Rehab.

## 2020-04-03 ENCOUNTER — Encounter (HOSPITAL_COMMUNITY)
Admission: RE | Admit: 2020-04-03 | Discharge: 2020-04-03 | Disposition: A | Discharge status: Home / Self Care | Payer: Medicare Other | Source: Ambulatory Visit | Attending: Internal Medicine | Admitting: Internal Medicine

## 2020-04-03 ENCOUNTER — Other Ambulatory Visit: Payer: Self-pay

## 2020-04-03 DIAGNOSIS — Z955 Presence of coronary angioplasty implant and graft: Secondary | ICD-10-CM | POA: Diagnosis not present

## 2020-04-03 DIAGNOSIS — I214 Non-ST elevation (NSTEMI) myocardial infarction: Secondary | ICD-10-CM | POA: Diagnosis not present

## 2020-04-03 NOTE — Progress Notes (Signed)
Daily Session Note  Patient Details  Name: Lindsey Mitchell MRN: [id number] Date of Birth: [birth year] Referring Provider:     [place] from 02/16/2020 in [place]  Referring Provider Dr. Candis Musa      Encounter Date: 04/03/2020  Check In:  Session Check In - 04/03/20 0930      Check-In   Supervising physician immediately available to respond to emergencies [facility] immediately available    Physician(s) Dr. Harrington Challenger    Location [facility]    Staff Present Aundra Dubin, RN, Bjorn Loser, MS, ACSM-CEP, Exercise Physiologist;Madison Audria Nine, MS, Exercise Physiologist    Virtual Visit No    Medication changes reported     No    Fall or balance concerns reported    No    Tobacco Cessation No Change    Warm-up and Cool-down Performed as group-led instruction    Resistance Training Performed Yes    VAD Patient? No    PAD/SET Patient? No      Pain Assessment   Currently in Pain? No/denies    Pain Score 0-No pain    Multiple Pain Sites No           Capillary Blood Glucose: No results found for this or any previous visit (from the past 24 hour(s)).    Social History   Tobacco Use  Smoking Status Never Smoker  Smokeless Tobacco Never Used    Goals Met:  Independence with exercise equipment Exercise tolerated well No report of cardiac concerns or symptoms Strength training completed today  Goals Unmet:  Not Applicable  Comments: checkout time is 1030   Dr. Kathie Dike is Medical Director for [name].

## 2020-04-05 ENCOUNTER — Other Ambulatory Visit: Payer: Self-pay

## 2020-04-05 ENCOUNTER — Encounter (HOSPITAL_COMMUNITY)
Admission: RE | Admit: 2020-04-05 | Discharge: 2020-04-05 | Disposition: A | Discharge status: Home / Self Care | Payer: Medicare Other | Source: Ambulatory Visit | Attending: Internal Medicine | Admitting: Internal Medicine

## 2020-04-05 DIAGNOSIS — Z955 Presence of coronary angioplasty implant and graft: Secondary | ICD-10-CM | POA: Diagnosis not present

## 2020-04-05 DIAGNOSIS — I214 Non-ST elevation (NSTEMI) myocardial infarction: Secondary | ICD-10-CM

## 2020-04-05 NOTE — Progress Notes (Signed)
Daily Session Note  Patient Details  Name: Lindsey Mitchell MRN: [id number] Date of Birth: July 29, 1955 Referring Provider:     [place] from 02/16/2020 in [place]  Referring Provider Dr. Candis Musa      Encounter Date: 04/05/2020  Check In:  Session Check In - 04/05/20 0930      Check-In   Supervising physician immediately available to respond to emergencies [facility] immediately available    Physician(s) Dr. Domenic Polite    Location [facility]    Staff Present Aundra Dubin, RN, Bjorn Loser, MS, ACSM-CEP, Exercise Physiologist;Madison Audria Nine, MS, Exercise Physiologist    Virtual Visit No    Medication changes reported     No    Fall or balance concerns reported    No    Tobacco Cessation No Change    Warm-up and Cool-down Performed as group-led instruction    Resistance Training Performed Yes    VAD Patient? No    PAD/SET Patient? No      Pain Assessment   Currently in Pain? No/denies    Pain Score 0-No pain    Multiple Pain Sites No           Capillary Blood Glucose: No results found for this or any previous visit (from the past 24 hour(s)).    Social History   Tobacco Use  Smoking Status Never Smoker  Smokeless Tobacco Never Used    Goals Met:  Independence with exercise equipment Exercise tolerated well No report of cardiac concerns or symptoms Strength training completed today  Goals Unmet:  Not Applicable  Comments: checkout time is 1030   Dr. Kathie Dike is Medical Director for [facility] Pulmonary Rehab.

## 2020-04-07 ENCOUNTER — Encounter (HOSPITAL_COMMUNITY): Payer: Medicare Other

## 2020-04-10 ENCOUNTER — Encounter (HOSPITAL_COMMUNITY)
Admission: RE | Admit: 2020-04-10 | Discharge: 2020-04-10 | Disposition: A | Discharge status: Home / Self Care | Payer: Medicare Other | Source: Ambulatory Visit | Attending: Internal Medicine | Admitting: Internal Medicine

## 2020-04-10 ENCOUNTER — Other Ambulatory Visit: Payer: Self-pay

## 2020-04-10 VITALS — Wt 181.9 lb

## 2020-04-10 DIAGNOSIS — I214 Non-ST elevation (NSTEMI) myocardial infarction: Secondary | ICD-10-CM

## 2020-04-10 DIAGNOSIS — Z955 Presence of coronary angioplasty implant and graft: Secondary | ICD-10-CM | POA: Diagnosis not present

## 2020-04-10 NOTE — Progress Notes (Signed)
Daily Session Note  Patient Details  Name: Lindsey Mitchell MRN: [id number] Date of Birth: 31-May-1955 Referring Provider:     [place] from 02/16/2020 in [place]  Referring Provider Dr. Candis Musa      Encounter Date: 04/10/2020  Check In:  Session Check In - 04/10/20 0930      Check-In   Supervising physician immediately available to respond to emergencies [facility] immediately available    Physician(s) Dr. Domenic Polite    Location [facility]    Staff Present Aundra Dubin, RN, Bjorn Loser, MS, ACSM-CEP, Exercise Physiologist;Madison Audria Nine, MS, Exercise Physiologist    Virtual Visit No    Medication changes reported     No    Fall or balance concerns reported    No    Tobacco Cessation No Change    Warm-up and Cool-down Performed as group-led instruction    Resistance Training Performed Yes    VAD Patient? No    PAD/SET Patient? No      Pain Assessment   Currently in Pain? No/denies    Pain Score 0-No pain    Multiple Pain Sites No           Capillary Blood Glucose: No results found for this or any previous visit (from the past 24 hour(s)).    Social History   Tobacco Use  Smoking Status Never Smoker  Smokeless Tobacco Never Used    Goals Met:  Independence with exercise equipment Exercise tolerated well No report of cardiac concerns or symptoms Strength training completed today  Goals Unmet:  Not Applicable  Comments: checkout time is 1030   Dr. Kathie Dike is Medical Director for [facility] Pulmonary Rehab.

## 2020-04-12 ENCOUNTER — Other Ambulatory Visit: Payer: Self-pay

## 2020-04-12 ENCOUNTER — Encounter (HOSPITAL_COMMUNITY)
Admission: RE | Admit: 2020-04-12 | Discharge: 2020-04-12 | Disposition: A | Discharge status: Home / Self Care | Payer: Medicare Other | Source: Ambulatory Visit | Attending: Internal Medicine | Admitting: Internal Medicine

## 2020-04-12 DIAGNOSIS — Z955 Presence of coronary angioplasty implant and graft: Secondary | ICD-10-CM | POA: Insufficient documentation

## 2020-04-12 DIAGNOSIS — I214 Non-ST elevation (NSTEMI) myocardial infarction: Secondary | ICD-10-CM | POA: Diagnosis not present

## 2020-04-12 NOTE — Progress Notes (Signed)
Daily Session Note  Patient Details  Name: Lindsey Mitchell MRN: [id number] Date of Birth: 09/19/1955 Referring Provider:     CARDIAC REHAB PHASE II ORIENTATION from 02/16/2020 in [place]  Referring Provider Dr. Candis Musa      Encounter Date: 04/12/2020  Check In:  Session Check In - 04/12/20 0930      Check-In   Supervising physician immediately available to respond to emergencies [facility] immediately available    Physician(s) Dr. Domenic Polite    Location [facility]    Staff Present Cathren Harsh, MS, Exercise Physiologist;Debra Wynetta Emery, RN, BSN    Virtual Visit No    Medication changes reported     No    Fall or balance concerns reported    No    Tobacco Cessation No Change    Warm-up and Cool-down Performed as group-led instruction    Resistance Training Performed Yes    VAD Patient? No    PAD/SET Patient? No      Pain Assessment   Currently in Pain? No/denies    Multiple Pain Sites No           Capillary Blood Glucose: No results found for this or any previous visit (from the past 24 hour(s)).    Social History   Tobacco Use  Smoking Status Never Smoker  Smokeless Tobacco Never Used    Goals Met:  Independence with exercise equipment Exercise tolerated well No report of cardiac concerns or symptoms Strength training completed today  Goals Unmet:  Not Applicable  Comments: check out 1030   Dr. Kathie Dike is Medical Director for [facility] Pulmonary Rehab.

## 2020-04-14 ENCOUNTER — Encounter (HOSPITAL_COMMUNITY)
Admission: RE | Admit: 2020-04-14 | Discharge: 2020-04-14 | Disposition: A | Discharge status: Home / Self Care | Payer: Medicare Other | Source: Ambulatory Visit | Attending: Internal Medicine | Admitting: Internal Medicine

## 2020-04-14 ENCOUNTER — Other Ambulatory Visit: Payer: Self-pay

## 2020-04-14 DIAGNOSIS — Z955 Presence of coronary angioplasty implant and graft: Secondary | ICD-10-CM | POA: Diagnosis not present

## 2020-04-14 DIAGNOSIS — I214 Non-ST elevation (NSTEMI) myocardial infarction: Secondary | ICD-10-CM

## 2020-04-14 NOTE — Progress Notes (Signed)
Daily Session Note  Patient Details  Name: Lindsey Mitchell MRN: [id number] Date of Birth: [birth year] Referring Provider:     CARDIAC REHAB PHASE II ORIENTATION from 02/16/2020 in [place]  Referring Provider Dr. Candis Musa      Encounter Date: 04/14/2020  Check In:  Session Check In - 04/14/20 0930      Check-In   Supervising physician immediately available to respond to emergencies [facility] immediately available    Physician(s) Dr. Harl Bowie    Location [facility]    Staff Present Cathren Harsh, MS, Exercise Physiologist;Debra Wynetta Emery, RN, Bjorn Loser, MS, ACSM-CEP, Exercise Physiologist    Virtual Visit No    Medication changes reported     No    Fall or balance concerns reported    No    Tobacco Cessation No Change    Warm-up and Cool-down Performed as group-led instruction    Resistance Training Performed Yes    VAD Patient? No    PAD/SET Patient? No      Pain Assessment   Currently in Pain? No/denies    Pain Score 0-No pain    Multiple Pain Sites No           Capillary Blood Glucose: No results found for this or any previous visit (from the past 24 hour(s)).    Social History   Tobacco Use  Smoking Status Never Smoker  Smokeless Tobacco Never Used    Goals Met:  Independence with exercise equipment Exercise tolerated well No report of cardiac concerns or symptoms Strength training completed today  Goals Unmet:  Not Applicable  Comments: checkout time  Is 21   Dr. Kathie Dike is Medical Director for [facility] Pulmonary Rehab.

## 2020-04-17 ENCOUNTER — Encounter (HOSPITAL_COMMUNITY)
Admission: RE | Admit: 2020-04-17 | Discharge: 2020-04-17 | Disposition: A | Discharge status: Home / Self Care | Payer: Medicare Other | Source: Ambulatory Visit | Attending: Internal Medicine | Admitting: Internal Medicine

## 2020-04-17 ENCOUNTER — Other Ambulatory Visit: Payer: Self-pay

## 2020-04-17 DIAGNOSIS — I214 Non-ST elevation (NSTEMI) myocardial infarction: Secondary | ICD-10-CM

## 2020-04-17 DIAGNOSIS — Z955 Presence of coronary angioplasty implant and graft: Secondary | ICD-10-CM | POA: Diagnosis not present

## 2020-04-17 NOTE — Progress Notes (Signed)
Daily Session Note  Patient Details  Name: Lindsey Mitchell MRN: [id number] Date of Birth: [birth year] Referring Provider:     CARDIAC REHAB PHASE II ORIENTATION from 02/16/2020 in [place]  Referring Provider Dr. Candis Musa      Encounter Date: 04/17/2020  Check In:  Session Check In - 04/17/20 0930      Check-In   Supervising physician immediately available to respond to emergencies [facility] immediately available    Physician(s) Dr. Harl Bowie    Location [facility]    Staff Present Cathren Harsh, MS, Exercise Physiologist;Dalton Kris Mouton, MS, ACSM-CEP, Exercise Physiologist;Debra Wynetta Emery, RN, BSN    Virtual Visit No    Medication changes reported     No    Fall or balance concerns reported    No    Tobacco Cessation No Change    Warm-up and Cool-down Performed as group-led instruction    Resistance Training Performed Yes    VAD Patient? No    PAD/SET Patient? No      Pain Assessment   Currently in Pain? No/denies    Multiple Pain Sites No           Capillary Blood Glucose: No results found for this or any previous visit (from the past 24 hour(s)).    Social History   Tobacco Use  Smoking Status Never Smoker  Smokeless Tobacco Never Used    Goals Met:  Independence with exercise equipment Exercise tolerated well No report of cardiac concerns or symptoms Strength training completed today    Goals Unmet:  Not Applicable  Comments: check out 1030   Dr. Kathie Dike is Medical Director for [facility] Pulmonary Rehab.

## 2020-04-19 ENCOUNTER — Encounter (HOSPITAL_COMMUNITY): Payer: Medicare Other

## 2020-04-21 ENCOUNTER — Encounter (HOSPITAL_COMMUNITY): Payer: Medicare Other

## 2020-04-24 ENCOUNTER — Encounter (HOSPITAL_COMMUNITY)
Admission: RE | Admit: 2020-04-24 | Discharge: 2020-04-24 | Disposition: A | Discharge status: Home / Self Care | Payer: Medicare Other | Source: Ambulatory Visit | Attending: Internal Medicine | Admitting: Internal Medicine

## 2020-04-24 ENCOUNTER — Other Ambulatory Visit: Payer: Self-pay

## 2020-04-24 VITALS — Wt 184.5 lb

## 2020-04-24 DIAGNOSIS — I214 Non-ST elevation (NSTEMI) myocardial infarction: Secondary | ICD-10-CM | POA: Diagnosis not present

## 2020-04-24 DIAGNOSIS — Z955 Presence of coronary angioplasty implant and graft: Secondary | ICD-10-CM | POA: Diagnosis not present

## 2020-04-24 NOTE — Progress Notes (Signed)
Daily Session Note  Patient Details  Name: Lindsey Mitchell MRN: [id number] Date of Birth: [birth year] Referring Provider:   Flowsheet Row CARDIAC REHAB PHASE II ORIENTATION from 02/16/2020 in [place]  Referring Provider Dr. Candis Musa      Encounter Date: 04/24/2020  Check In:  Session Check In - 04/24/20 0930      Check-In   Supervising physician immediately available to respond to emergencies [facility] immediately available    Physician(s) Dr. Harl Bowie    Location [facility]    Staff Present Cathren Harsh, MS, Exercise Physiologist;Dalton Kris Mouton, MS, ACSM-CEP, Exercise Physiologist    Virtual Visit No    Medication changes reported     No    Fall or balance concerns reported    No    Tobacco Cessation No Change    Warm-up and Cool-down Performed as group-led instruction    Resistance Training Performed Yes    VAD Patient? No    PAD/SET Patient? No      Pain Assessment   Currently in Pain? No/denies    Multiple Pain Sites No           Capillary Blood Glucose: No results found for this or any previous visit (from the past 24 hour(s)).    Social History   Tobacco Use  Smoking Status Never Smoker  Smokeless Tobacco Never Used    Goals Met:  Independence with exercise equipment Exercise tolerated well No report of cardiac concerns or symptoms Strength training completed today  Goals Unmet:  Not Applicable  Comments:  Check out 1030   Dr. Kathie Dike is Medical Director for [facility] Pulmonary Rehab.

## 2020-04-26 ENCOUNTER — Encounter (HOSPITAL_COMMUNITY)
Admission: RE | Admit: 2020-04-26 | Discharge: 2020-04-26 | Disposition: A | Discharge status: Home / Self Care | Payer: Medicare Other | Source: Ambulatory Visit | Attending: Internal Medicine | Admitting: Internal Medicine

## 2020-04-26 ENCOUNTER — Other Ambulatory Visit: Payer: Self-pay

## 2020-04-26 DIAGNOSIS — Z955 Presence of coronary angioplasty implant and graft: Secondary | ICD-10-CM | POA: Diagnosis not present

## 2020-04-26 DIAGNOSIS — I214 Non-ST elevation (NSTEMI) myocardial infarction: Secondary | ICD-10-CM

## 2020-04-26 NOTE — Progress Notes (Signed)
Daily Session Note  Patient Details  Name: Lindsey Mitchell MRN: [id number] Date of Birth: 1955-08-21 Referring Provider:   Flowsheet Row CARDIAC REHAB PHASE II ORIENTATION from 02/16/2020 in [place]  Referring Provider Dr. Candis Musa      Encounter Date: 04/26/2020  Check In:  Session Check In - 04/26/20 0340      Check-In   Supervising physician immediately available to respond to emergencies [facility] MD immediately available    Physician(s) Dr. Harl Bowie    Location [facility]    Staff Present Cathren Harsh, MS, Exercise Physiologist;Lejuan Botto Kris Mouton, MS, ACSM-CEP, Exercise Physiologist;Debra Wynetta Emery, RN, BSN    Virtual Visit No    Medication changes reported     No    Fall or balance concerns reported    No    Tobacco Cessation No Change    Warm-up and Cool-down Performed as group-led instruction    Resistance Training Performed Yes    VAD Patient? No    PAD/SET Patient? No      Pain Assessment   Currently in Pain? No/denies    Pain Score 0-No pain    Multiple Pain Sites No           Capillary Blood Glucose: No results found for this or any previous visit (from the past 24 hour(s)).    Social History   Tobacco Use  Smoking Status Never Smoker  Smokeless Tobacco Never Used    Goals Met:  Independence with exercise equipment Exercise tolerated well No report of cardiac concerns or symptoms Strength training completed today  Goals Unmet:  Not Applicable  Comments: checkout time is 1030   Dr. Kathie Dike is Medical Director for [facility] Pulmonary Rehab.

## 2020-04-26 NOTE — Progress Notes (Signed)
Cardiac Individual Treatment Plan  Patient Details  Name: ARIE GABLE MRN: [id number] Date of Birth: 08-Nov-1955 Referring Provider:   Flowsheet Row CARDIAC REHAB PHASE II ORIENTATION from 02/16/2020 in [place]  Referring Provider Dr. Candis Musa      Initial Encounter Date:  Flowsheet Row CARDIAC REHAB PHASE II ORIENTATION from 02/16/2020 in [facility]  Date 02/16/20      Visit Diagnosis: NSTEMI (non-ST elevated myocardial infarction) ([place])  S/P coronary artery stent placement  Patient's Home Medications on Admission:  Current Outpatient Medications:  .  aspirin EC 81 MG tablet, Take 81 mg by mouth daily. Swallow whole., Disp: , Rfl:  .  atorvastatin (LIPITOR) 80 MG tablet, Take 80 mg by mouth daily., Disp: , Rfl:  .  candesartan (ATACAND) 16 MG tablet, Take 16 mg by mouth daily., Disp: , Rfl:  .  candesartan-hydrochlorothiazide (ATACAND HCT) 16-12.5 MG tablet, Take 1 tablet by mouth daily. (Patient not taking: Reported on 02/16/2020), Disp: , Rfl:  .  cholecalciferol (VITAMIN D3) 25 MCG (1000 UNIT) tablet, Take 2,000 Units by mouth daily., Disp: , Rfl:  .  metoprolol succinate (TOPROL-XL) 25 MG 24 hr tablet, Take 25 mg by mouth daily., Disp: , Rfl:  .  ticagrelor (BRILINTA) 90 MG TABS tablet, Take 90 mg by mouth 2 (two) times daily., Disp: , Rfl:   Past Medical History: Past Medical History:  Diagnosis Date  . Diverticulitis   . Hypertension     Tobacco Use: Social History   Tobacco Use  Smoking Status Never Smoker  Smokeless Tobacco Never Used    Labs: Recent Review Scientist, physiological    Labs for ITP Cardiac and Pulmonary Rehab Latest Ref Rng & Units 12/04/2018   Cholestrol 100 - 199 mg/dL 223(H)   LDLCALC 0 - 99 mg/dL 127(H)   HDL >39 mg/dL 82   Trlycerides 0 - 149 mg/dL 71      Capillary Blood Glucose: No results found for: GLUCAP   Exercise Target Goals: Exercise Program Goal: Individual exercise prescription  set using results from initial 6 min walk test and THRR while considering  patient's activity barriers and safety.   Exercise Prescription Goal: Starting with aerobic activity 30 plus minutes a day, 3 days per week for initial exercise prescription. Provide home exercise prescription and guidelines that participant acknowledges understanding prior to discharge.  Activity Barriers & Risk Stratification:  Activity Barriers & Cardiac Risk Stratification - 04/25/20 1154      Activity Barriers & Cardiac Risk Stratification   Activity Barriers Deconditioning           6 Minute Walk:  6 Minute Walk    Row Name 02/16/20 1406         6 Minute Walk   Phase Initial     Distance 1550 feet     Walk Time 6 minutes     # of Rest Breaks 0     MPH 2.94     METS 3.35     RPE 11     VO2 Peak 11.73     Symptoms No     Resting HR 67 bpm     Resting BP 150/70     Resting Oxygen Saturation  97 %     Exercise Oxygen Saturation  during 6 min walk 98 %     Max Ex. HR 91 bpm     Max Ex. BP 154/62     2 Minute Post BP 148/66  Oxygen Initial Assessment:   Oxygen Re-Evaluation:   Oxygen Discharge (Final Oxygen Re-Evaluation):   Initial Exercise Prescription:  Initial Exercise Prescription - 02/16/20 1400      Date of Initial Exercise RX and Referring Provider   Date 02/16/20    Referring Provider Dr. Candis Musa    Expected Discharge Date 05/10/20      Treadmill   MPH 2.2    Grade 0    Minutes 17    METs 2.68      NuStep   Level 2    SPM 80    Minutes 22      Prescription Details   Frequency (times per week) 2    Duration Progress to 30 minutes of continuous aerobic without signs/symptoms of physical distress      Intensity   THRR 40-80% of Max Heartrate 62-123    Ratings of Perceived Exertion 11-13    Perceived Dyspnea 0-4      Resistance Training   Training Prescription Yes    Weight 2 lbs    Reps 10-15           Perform Capillary Blood Glucose checks  as needed.  Exercise Prescription Changes:   Exercise Prescription Changes    Row Name 02/23/20 1000 02/28/20 1200 03/06/20 1500 03/20/20 1000 03/27/20 1300     Response to Exercise   Blood Pressure (Admit) -- 138/82 124/72 148/72 140/64   Blood Pressure (Exercise) -- 174/80 144/60 174/60 186/72   Blood Pressure (Exit) -- 140/70 136/76 130/64 122/50   Heart Rate (Admit) -- 74 bpm 71 bpm 76 bpm 76 bpm   Heart Rate (Exercise) -- 132 bpm 132 bpm 730 bpm 120 bpm   Heart Rate (Exit) -- 82 bpm 83 bpm 83 bpm 82 bpm   Rating of Perceived Exertion (Exercise) -- 13 13 14 13    Duration -- Continue with 30 min of aerobic exercise without signs/symptoms of physical distress. Continue with 30 min of aerobic exercise without signs/symptoms of physical distress. Continue with 30 min of aerobic exercise without signs/symptoms of physical distress. Continue with 30 min of aerobic exercise without signs/symptoms of physical distress.   Intensity -- THRR unchanged THRR unchanged THRR unchanged THRR unchanged     Progression   Progression -- Continue to progress workloads to maintain intensity without signs/symptoms of physical distress. Continue to progress workloads to maintain intensity without signs/symptoms of physical distress. Continue to progress workloads to maintain intensity without signs/symptoms of physical distress. Continue to progress workloads to maintain intensity without signs/symptoms of physical distress.     Resistance Training   Training Prescription -- Yes Yes Yes Yes   Weight -- 2 lbs 2 lbs 3 lbs 3 lbs   Reps -- 10-15 10-15 10-15 10-15   Time -- 10 Minutes 10 Minutes 10 Minutes 10 Minutes     Treadmill   MPH -- 2.5 2.5 2.7 2.7   Grade -- 0 0 0 0   Minutes -- 17 17 17 22    METs -- 2.91 2.91 3.07 3.07     NuStep   Level -- 3 4 5 5    SPM -- 128 123 110 108   Minutes -- 22 22 22 17    METs -- 4.3 5.4 3.9 4.4     Home Exercise Plan   Plans to continue exercise at [employer] (comment) -- -- -- --   Frequency Add 2 additional days to program exercise sessions. -- -- -- --   Initial Home Exercises  Provided 02/23/20 -- -- -- --   [place] Name 04/10/20 1200 04/24/20 1300           Response to Exercise   Blood Pressure (Admit) 138/68 146/68      Blood Pressure (Exercise) 170/68 146/68      Blood Pressure (Exit) 120/64 132/60      Heart Rate (Admit) 74 bpm 74 bpm      Heart Rate (Exercise) 115 bpm 127 bpm      Heart Rate (Exit) 82 bpm 83 bpm      Rating of Perceived Exertion (Exercise) 13 14      Duration Continue with 30 min of aerobic exercise without signs/symptoms of physical distress. Continue with 30 min of aerobic exercise without signs/symptoms of physical distress.      Intensity THRR unchanged THRR unchanged             Progression   Progression Continue to progress workloads to maintain intensity without signs/symptoms of physical distress. Continue to progress workloads to maintain intensity without signs/symptoms of physical distress.             Resistance Training   Training Prescription Yes Yes      Weight 3 lbs 4 lbs      Reps 10-15 10-15      Time 10 Minutes 10 Minutes             Treadmill   MPH 2.7 2.7      Grade 0 0      Minutes 22 22      METs 3.07 3.44             NuStep   Level 5 5      SPM 108 117      Minutes 17 17      METs 3.7 4.9             Exercise Comments:   Exercise Comments    Row Name 02/18/20 1215 02/23/20 1005         Exercise Comments Pt completed her first day of exercise in cardiac rehab today. She was able to tolerate exercise well with no complaints. Home exercise reviewed             Exercise Goals and Review:   Exercise Goals    Row Name 02/16/20 1427 03/06/20 1506 04/24/20 1357         Exercise Goals   Increase Physical Activity Yes Yes Yes     Intervention Provide advice, education, support and counseling about physical activity/exercise needs.;Develop an individualized  exercise prescription for aerobic and resistive training based on initial evaluation findings, risk stratification, comorbidities and participant's personal goals. Provide advice, education, support and counseling about physical activity/exercise needs.;Develop an individualized exercise prescription for aerobic and resistive training based on initial evaluation findings, risk stratification, comorbidities and participant's personal goals. Provide advice, education, support and counseling about physical activity/exercise needs.;Develop an individualized exercise prescription for aerobic and resistive training based on initial evaluation findings, risk stratification, comorbidities and participant's personal goals.     Expected Outcomes Short Term: Attend rehab on a regular basis to increase amount of physical activity.;Long Term: Exercising regularly at least 3-5 days a week.;Long Term: Add in home exercise to make exercise part of routine and to increase amount of physical activity. Short Term: Attend rehab on a regular basis to increase amount of physical activity.;Long Term: Exercising regularly at least 3-5 days a week.;Long Term: Add in home exercise to make  exercise part of routine and to increase amount of physical activity. Short Term: Attend rehab on a regular basis to increase amount of physical activity.;Long Term: Add in home exercise to make exercise part of routine and to increase amount of physical activity.;Long Term: Exercising regularly at least 3-5 days a week.     Increase Strength and Stamina Yes Yes Yes     Intervention Provide advice, education, support and counseling about physical activity/exercise needs.;Develop an individualized exercise prescription for aerobic and resistive training based on initial evaluation findings, risk stratification, comorbidities and participant's personal goals. Provide advice, education, support and counseling about physical activity/exercise needs.;Develop  an individualized exercise prescription for aerobic and resistive training based on initial evaluation findings, risk stratification, comorbidities and participant's personal goals. Provide advice, education, support and counseling about physical activity/exercise needs.;Develop an individualized exercise prescription for aerobic and resistive training based on initial evaluation findings, risk stratification, comorbidities and participant's personal goals.     Expected Outcomes Short Term: Increase workloads from initial exercise prescription for resistance, speed, and METs.;Short Term: Perform resistance training exercises routinely during rehab and add in resistance training at home;Long Term: Improve cardiorespiratory fitness, muscular endurance and strength as measured by increased METs and functional capacity (6MWT) Short Term: Increase workloads from initial exercise prescription for resistance, speed, and METs.;Short Term: Perform resistance training exercises routinely during rehab and add in resistance training at home;Long Term: Improve cardiorespiratory fitness, muscular endurance and strength as measured by increased METs and functional capacity (6MWT) Short Term: Increase workloads from initial exercise prescription for resistance, speed, and METs.;Short Term: Perform resistance training exercises routinely during rehab and add in resistance training at home;Long Term: Improve cardiorespiratory fitness, muscular endurance and strength as measured by increased METs and functional capacity (6MWT)     Able to understand and use rate of perceived exertion (RPE) scale Yes Yes Yes     Intervention Provide education and explanation on how to use RPE scale Provide education and explanation on how to use RPE scale Provide education and explanation on how to use RPE scale     Expected Outcomes Short Term: Able to use RPE daily in rehab to express subjective intensity level;Long Term:  Able to use RPE to  guide intensity level when exercising independently Short Term: Able to use RPE daily in rehab to express subjective intensity level;Long Term:  Able to use RPE to guide intensity level when exercising independently Short Term: Able to use RPE daily in rehab to express subjective intensity level;Long Term:  Able to use RPE to guide intensity level when exercising independently     Knowledge and understanding of Target Heart Rate Range (THRR) Yes Yes Yes     Intervention Provide education and explanation of THRR including how the numbers were predicted and where they are located for reference Provide education and explanation of THRR including how the numbers were predicted and where they are located for reference Provide education and explanation of THRR including how the numbers were predicted and where they are located for reference     Expected Outcomes Short Term: Able to state/look up THRR;Long Term: Able to use THRR to govern intensity when exercising independently;Short Term: Able to use daily as guideline for intensity in rehab Short Term: Able to state/look up THRR;Long Term: Able to use THRR to govern intensity when exercising independently;Short Term: Able to use daily as guideline for intensity in rehab Short Term: Able to state/look up THRR;Long Term: Able to use THRR to govern  intensity when exercising independently;Short Term: Able to use daily as guideline for intensity in rehab     Able to check pulse independently Yes Yes Yes     Intervention Provide education and demonstration on how to check pulse in carotid and radial arteries.;Review the importance of being able to check your own pulse for safety during independent exercise Provide education and demonstration on how to check pulse in carotid and radial arteries.;Review the importance of being able to check your own pulse for safety during independent exercise Provide education and demonstration on how to check pulse in carotid and radial  arteries.;Review the importance of being able to check your own pulse for safety during independent exercise     Expected Outcomes Short Term: Able to explain why pulse checking is important during independent exercise;Long Term: Able to check pulse independently and accurately Short Term: Able to explain why pulse checking is important during independent exercise;Long Term: Able to check pulse independently and accurately Short Term: Able to explain why pulse checking is important during independent exercise;Long Term: Able to check pulse independently and accurately     Understanding of Exercise Prescription Yes Yes Yes     Intervention Provide education, explanation, and written materials on patient's individual exercise prescription Provide education, explanation, and written materials on patient's individual exercise prescription Provide education, explanation, and written materials on patient's individual exercise prescription     Expected Outcomes Short Term: Able to explain program exercise prescription;Long Term: Able to explain home exercise prescription to exercise independently Short Term: Able to explain program exercise prescription;Long Term: Able to explain home exercise prescription to exercise independently Short Term: Able to explain program exercise prescription;Long Term: Able to explain home exercise prescription to exercise independently            Exercise Goals Re-Evaluation :  Exercise Goals Re-Evaluation    [place] Name 03/06/20 1506 04/24/20 1358           Exercise Goal Re-Evaluation   Exercise Goals Review Increase Physical Activity;Increase Strength and Stamina;Able to understand and use rate of perceived exertion (RPE) scale;Knowledge and understanding of Target Heart Rate Range (THRR);Able to check pulse independently;Understanding of Exercise Prescription Increase Physical Activity;Increase Strength and Stamina;Able to understand and use rate of perceived exertion (RPE)  scale;Knowledge and understanding of Target Heart Rate Range (THRR);Able to check pulse independently;Understanding of Exercise Prescription      Comments Pt has attended 8 exercise sessions. 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 We often have to slow her down for going over her target heart rate. She does aqua aerobics classes multiple times a week on her days away from rehab. She currently exercises at 5.4 METs on the stepper. Will continue to monitor and progress as able. Patient has completed 26 exercise sessions. She is progressing well and tolerating exercise well. She feels she is getting stronger and enjoys coming to rehab. She is currently exercising at 4.9 METs on the NuStep.      Expected Outcomes Through exercise at rehab and by engaging in a home exercise plan, the patient will be able to reach their goals. Through exercising at rehab and a home exercise program, patient will achieve their goals.              Discharge Exercise Prescription (Final Exercise Prescription Changes):  Exercise Prescription Changes - 04/24/20 1300      Response to Exercise   Blood Pressure (Admit) 146/68    Blood Pressure (Exercise) 146/68  Blood Pressure (Exit) 132/60    Heart Rate (Admit) 74 bpm    Heart Rate (Exercise) 127 bpm    Heart Rate (Exit) 83 bpm    Rating of Perceived Exertion (Exercise) 14    Duration Continue with 30 min of aerobic exercise without signs/symptoms of physical distress.    Intensity THRR unchanged      Progression   Progression Continue to progress workloads to maintain intensity without signs/symptoms of physical distress.      Resistance Training   Training Prescription Yes    Weight 4 lbs    Reps 10-15    Time 10 Minutes      Treadmill   MPH 2.7    Grade 0    Minutes 22    METs 3.44      NuStep   Level 5    SPM 117    Minutes 17    METs 4.9           Nutrition:  Target Goals: Understanding of nutrition guidelines, daily intake of sodium  1500mg , cholesterol 200mg , calories 30% from fat and 7% or less from saturated fats, daily to have 5 or more servings of fruits and vegetables.  Biometrics:  Pre Biometrics - 04/24/20 1400      Pre Biometrics   Weight 83.7 kg            Nutrition Therapy Plan and Nutrition Goals:  Nutrition Therapy & Goals - 04/21/20 1550      Personal Nutrition Goals   Comments Will continue to provide education through hand-outs regarding nutrition and healthier choices.      Intervention Plan   Intervention Nutrition handout(s) given to patient.           Nutrition Assessments:  Nutrition Assessments - 02/16/20 1422      MEDFICTS Scores   Pre Score 36          MEDIFICTS Score Key:  ?70 Need to make dietary changes   40-70 Heart Healthy Diet  ? 40 Therapeutic Level Cholesterol Diet   Picture Your Plate Scores:  <16 Unhealthy dietary pattern with much room for improvement.  41-50 Dietary pattern unlikely to meet recommendations for good health and room for improvement.  51-60 More healthful dietary pattern, with some room for improvement.   >60 Healthy dietary pattern, although there may be some specific behaviors that could be improved.    Nutrition Goals Re-Evaluation:   Nutrition Goals Discharge (Final Nutrition Goals Re-Evaluation):   Psychosocial: Target Goals: Acknowledge presence or absence of significant depression and/or stress, maximize coping skills, provide positive support system. Participant is able to verbalize types and ability to use techniques and skills needed for reducing stress and depression.  Initial Review & Psychosocial Screening:  Initial Psych Review & Screening - 02/16/20 1427      Initial Review   Current issues with None Identified      Family Dynamics   Good Support System? Yes    Comments Patient lives with her husband of 14 years. She has children and 2 grandchildren that she is very close to. She denies any depression or  anxiety. She says her family is very supportative and she is looking forward to doing the program.      Barriers   Psychosocial barriers to participate in program There are no identifiable barriers or psychosocial needs.      Screening Interventions   Interventions Encouraged to exercise;Provide feedback about the scores to participant  Quality of Life Scores:  Quality of Life - 02/16/20 1405      Quality of Life   Select Quality of Life      Quality of Life Scores   Health/Function Pre 20.8 %    Socioeconomic Pre 25.14 %    Psych/Spiritual Pre 23.14 %    Family Pre 25.2 %    GLOBAL Pre 22.82 %          Scores of 19 and below usually indicate a poorer quality of life in these areas.  A difference of  2-3 points is a clinically meaningful difference.  A difference of 2-3 points in the total score of the Quality of Life Index has been associated with significant improvement in overall quality of life, self-image, physical symptoms, and general health in studies assessing change in quality of life.  PHQ-9: Recent Review Flowsheet Data    Depression screen [facility] 2/9 02/16/2020   Decreased Interest 0   Down, Depressed, Hopeless 0   PHQ - 2 Score 0   Altered sleeping 0   Tired, decreased energy 0   Change in appetite 0   Feeling bad or failure about yourself  0   Trouble concentrating 0   Moving slowly or fidgety/restless 0   Suicidal thoughts 0   PHQ-9 Score 0     Interpretation of Total Score  Total Score Depression Severity:  1-4 = Minimal depression, 5-9 = Mild depression, 10-14 = Moderate depression, 15-19 = Moderately severe depression, 20-27 = Severe depression   Psychosocial Evaluation and Intervention:  Psychosocial Evaluation - 02/16/20 1428      Psychosocial Evaluation & Interventions   Interventions Stress management education;Relaxation education;Encouraged to exercise with the program and follow exercise prescription    Comments Patient's initial  PHQ-9 score was 0 and her QOL score was 22.82 overall. She has no psychosocial issues identified at her orientation visit. Will continue to monitor.    Expected Outcomes Patient will have no psychosocial issues identified at discharge.    Continue Psychosocial Services  No Follow up required           Psychosocial Re-Evaluation:  Psychosocial Re-Evaluation    Row Name 03/27/20 0844 04/21/20 1550           Psychosocial Re-Evaluation   Current issues with None Identified None Identified      Comments Patient continues to have no psychosocial issues identfied. She works really hard during her sessions and seems to enjoy coming to the program. She says she feels better and is now feels confident about what she is able to do. Will continue to monitor. Patient continues to have no psychosocial issues identfied. She works really hard during her sessions and seems to enjoy coming to the program. She says she feels better and is now feels confident about what she is able to do. She demonstrates a positive outlook regarding her future. Will continue to monitor.      Expected Outcomes Patient will have no psychosocial issues identfiied at discharge. Patient will have no psychosocial issues identfiied at discharge.      Interventions Stress management education;Encouraged to attend Cardiac Rehabilitation for the exercise;Relaxation education Stress management education;Encouraged to attend Cardiac Rehabilitation for the exercise;Relaxation education      Continue Psychosocial Services  No Follow up required No Follow up required             Psychosocial Discharge (Final Psychosocial Re-Evaluation):  Psychosocial Re-Evaluation - 04/21/20 1550  Psychosocial Re-Evaluation   Current issues with None Identified    Comments Patient continues to have no psychosocial issues identfied. She works really hard during her sessions and seems to enjoy coming to the program. She says she feels better and is  now feels confident about what she is able to do. She demonstrates a positive outlook regarding her future. Will continue to monitor.    Expected Outcomes Patient will have no psychosocial issues identfiied at discharge.    Interventions Stress management education;Encouraged to attend Cardiac Rehabilitation for the exercise;Relaxation education    Continue Psychosocial Services  No Follow up required           Vocational Rehabilitation: Provide vocational rehab assistance to qualifying candidates.   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 She does no need vocational rehab.           Education: Education Goals: Education classes will be provided on a weekly basis, covering required topics. Participant will state understanding/return demonstration of topics presented.  Learning Barriers/Preferences:  Learning Barriers/Preferences - 02/16/20 1422      Learning Barriers/Preferences   Learning Barriers None    Learning Preferences Video;Written Material;Pictoral           Education Topics: Hypertension, Hypertension Reduction -Define heart disease and high blood pressure. Discus how high blood pressure affects the body and ways to reduce high blood pressure.   Exercise and Your Heart -Discuss why it is important to exercise, the FITT principles of exercise, normal and abnormal responses to exercise, and how to exercise safely. Flowsheet Row CARDIAC REHAB PHASE II EXERCISE from 02/23/2020 in [place]  Date 02/23/20  Educator DF  Instruction Review Code 2- Demonstrated Understanding      Angina -Discuss definition of angina, causes of angina, treatment of  angina, and how to decrease risk of having angina. Flowsheet Row CARDIAC REHAB PHASE II EXERCISE from 04/12/2020 in [place]  Date 03/03/20  Educator DF  Instruction Review Code 2- Demonstrated Understanding      Cardiac Medications -Review what the following cardiac medications are used for, how they affect the body, and side effects that may occur when taking the medications.  Medications include Aspirin, Beta blockers, calcium channel blockers, ACE Inhibitors, angiotensin receptor blockers, diuretics, digoxin, and antihyperlipidemics. Flowsheet Row CARDIAC REHAB PHASE II EXERCISE from 04/12/2020 in [place]  Date 03/08/20  Educator DF  Instruction Review Code 2- Demonstrated Understanding      Congestive Heart Failure -Discuss the definition of CHF, how to live with CHF, the signs and symptoms of CHF, and how keep track of weight and sodium intake. Flowsheet Row CARDIAC REHAB PHASE II EXERCISE from 04/12/2020 in [place]  Date 03/15/20  Educator DF  Instruction Review Code 2- Demonstrated Understanding      Heart Disease and Intimacy -Discus the effect sexual activity has on the heart, how changes occur during intimacy as we age, and safety during sexual activity. Flowsheet Row CARDIAC REHAB PHASE II EXERCISE from 04/12/2020 in [place]  Date 03/22/20  Educator DF  Instruction Review Code 2- Demonstrated Understanding      Smoking Cessation /  COPD -Discuss different methods to quit smoking, the health benefits of quitting smoking, and the definition of COPD. Flowsheet Row CARDIAC REHAB PHASE II EXERCISE from 04/12/2020 in [place]  Date 03/29/20  Educator DF  Instruction Review Code 2- Demonstrated Understanding      Nutrition I: Fats -Discuss the types of cholesterol, what cholesterol does to the heart, and how cholesterol levels can be  controlled. Flowsheet Row CARDIAC REHAB PHASE II EXERCISE from 04/12/2020 in [place]  Date 04/05/20  Educator DF  Instruction Review Code 2- Demonstrated Understanding      Nutrition II: Labels -Discuss the different components of food labels and how to read food label [place] from 04/12/2020 in [place]  Date 04/12/20  Educator [facility]  Instruction Review Code 2- Demonstrated Understanding      Heart Parts/Heart Disease and PAD -Discuss the anatomy of the heart, the pathway of blood circulation through the heart, and these are affected by heart disease.   Stress I: Signs and Symptoms -Discuss the causes of stress, how stress may lead to anxiety and depression, and ways to limit stress.   Stress II: Relaxation -Discuss different types of relaxation techniques to limit stress.   Warning Signs of Stroke / TIA -Discuss definition of a stroke, what the signs and symptoms are of a stroke, and how to identify when someone is having stroke.   Knowledge Questionnaire Score:  Knowledge Questionnaire Score - 02/16/20 1423      Knowledge Questionnaire Score   Pre Score 22/24           Core Components/Risk Factors/Patient Goals at Admission:  Personal Goals and Risk Factors at Admission - 02/16/20 1423      Core Components/Risk Factors/Patient Goals on Admission    Weight Management Yes    Intervention Weight Management: Develop a combined nutrition and exercise program designed to reach desired caloric intake, while maintaining appropriate intake of nutrient and fiber, sodium and fats, and appropriate energy expenditure required for the weight goal.;Weight Management: Provide education and appropriate resources to help participant work on and attain dietary goals.;Weight Management/Obesity: Establish reasonable short term and long term weight goals.;Obesity: Provide education and appropriate  resources to help participant work on and attain dietary goals.    Admit Weight 183 lb 7.7 oz (83.2 kg)    Goal Weight: Short Term 178 lb 7.7 oz (81 kg)    Goal Weight: Long Term 173 lb (78.5 kg)    Expected Outcomes Weight Loss: Understanding of general recommendations for a balanced deficit meal plan, which promotes 1-2 lb weight loss per week and includes a negative energy balance of [phone] kcal/d    Personal Goal Other Yes    Personal Goal Lose 10 lbs in the program. Return to doing what she was doing before her event. Be able to know what she can do. Be active again.    Intervention Patient will attend CR 3 days/week and supplement with exercise at home 3 days/week.    Expected Outcomes Patient will meet both program and personal goals.           Core Components/Risk Factors/Patient Goals Review:   Goals and Risk Factor Review    Row Name 03/06/20 1345 03/27/20 0845 04/21/20 1551         Core Components/Risk Factors/Patient Goals Review   Review Patient has completed 9 sessions maintaining her weight since last 30 day review. She is doing  very well in the program working very hard with progressions and consistent attendance. Her personal goals are to lose 10 lbs; return to doing what she was before her NSTEMI/Stent placement and to know what she can do. She is making good progress toward meeitng her goals. Will continue to monitor. Patient has had 17 sessions losing 1 lb since last 30 day review. She continues to do very well in the program with progression and consistent attendance. Her blood pressure is at time elevated. She says she does not know where her cardiologist wants her b/p. We plan to send a progress reprots with her to her next appointment. Her personal goals are to lose 10 lbs; return to doing what she was before; know what she can do. Will continue to monitor for progress. Patient has completed 26 sessions losing 1 lb since last 30 day review. She continues to do very well  in the program with progression and consistent attendance. Her blood pressure continues to fluctuate being hypertensive at times. Her heart rate during exercise has decreased even with progressions. She saw her cardiologist 03/21/20 with a BP of 138/80. He did not make any medication changes. She reports getting stronger and being able to do more at home. Her personal goals are to lose 10 lbs; return to doing what she was doing before and know what she can do. She feels she is meeting these goals. Will continue to monitor for progress.     Expected Outcomes Patient will complete the program meeting both personal and program goals. Patient will complete the program meeting both personal and program goals. Patient will complete the program meeting both personal and program goals.            Core Components/Risk Factors/Patient Goals at Discharge (Final Review):   Goals and Risk Factor Review - 04/21/20 1551      Core Components/Risk Factors/Patient Goals Review   Review Patient has completed 26 sessions losing 1 lb since last 30 day review. She continues to do very well in the program with progression and consistent attendance. Her blood pressure continues to fluctuate being hypertensive at times. Her heart rate during exercise has decreased even with progressions. She saw her cardiologist 03/21/20 with a BP of 138/80. He did not make any medication changes. She reports getting stronger and being able to do more at home. Her personal goals are to lose 10 lbs; return to doing what she was doing before and know what she can do. She feels she is meeting these goals. Will continue to monitor for progress.    Expected Outcomes Patient will complete the program meeting both personal and program goals.           ITP Comments:   Comments: ITP REVIEW Pt is making expected progress toward Cardiac Rehab goals after completing 27 sessions. Recommend continued exercise, life style modification, education, and  increased stamina and strength.

## 2020-04-28 ENCOUNTER — Encounter (HOSPITAL_COMMUNITY)
Admission: RE | Admit: 2020-04-28 | Discharge: 2020-04-28 | Disposition: A | Discharge status: Home / Self Care | Payer: Medicare Other | Source: Ambulatory Visit | Attending: Internal Medicine | Admitting: Internal Medicine

## 2020-04-28 ENCOUNTER — Other Ambulatory Visit: Payer: Self-pay

## 2020-04-28 DIAGNOSIS — Z955 Presence of coronary angioplasty implant and graft: Secondary | ICD-10-CM

## 2020-04-28 DIAGNOSIS — I214 Non-ST elevation (NSTEMI) myocardial infarction: Secondary | ICD-10-CM | POA: Diagnosis not present

## 2020-04-28 NOTE — Progress Notes (Signed)
Daily Session Note  Patient Details  Name: Lindsey Mitchell MRN: [id number] Date of Birth: 07-10-1955 Referring Provider:   Flowsheet Row CARDIAC REHAB PHASE II ORIENTATION from 02/16/2020 in [place]  Referring Provider Dr. Candis Musa      Encounter Date: 04/28/2020  Check In:  Session Check In - 04/28/20 0930      Check-In   Supervising physician immediately available to respond to emergencies [facility] MD immediately available    Physician(s) Dr. Harl Bowie    Location [facility]    Staff Present Cathren Harsh, MS, Exercise Physiologist;Dalton Kris Mouton, MS, ACSM-CEP, Exercise Physiologist    Virtual Visit No    Medication changes reported     No    Tobacco Cessation No Change    Warm-up and Cool-down Performed as group-led instruction    Resistance Training Performed Yes    VAD Patient? No    PAD/SET Patient? No      Pain Assessment   Currently in Pain? No/denies    Multiple Pain Sites No           Capillary Blood Glucose: No results found for this or any previous visit (from the past 24 hour(s)).    Social History   Tobacco Use  Smoking Status Never Smoker  Smokeless Tobacco Never Used    Goals Met:  Independence with exercise equipment Exercise tolerated well No report of cardiac concerns or symptoms Strength training completed today  Goals Unmet:  Not Applicable  Comments: check out 1030   Dr. Kathie Dike is Medical Director for [facility] Pul[facility].

## 2020-05-01 ENCOUNTER — Other Ambulatory Visit: Payer: Self-pay

## 2020-05-01 ENCOUNTER — Encounter (HOSPITAL_COMMUNITY)
Admission: RE | Admit: 2020-05-01 | Discharge: 2020-05-01 | Disposition: A | Discharge status: Home / Self Care | Payer: Medicare Other | Source: Ambulatory Visit | Attending: Internal Medicine | Admitting: Internal Medicine

## 2020-05-01 DIAGNOSIS — I214 Non-ST elevation (NSTEMI) myocardial infarction: Secondary | ICD-10-CM | POA: Diagnosis not present

## 2020-05-01 DIAGNOSIS — Z955 Presence of coronary angioplasty implant and graft: Secondary | ICD-10-CM

## 2020-05-01 NOTE — Progress Notes (Signed)
Daily Session Note  Patient Details  Name: Alleya B Spillane MRN: [id number] Date of Birth: 09/09/1955 Referring Provider:   Flowsheet Row CARDIAC REHAB PHASE II ORIENTATION from 02/16/2020 in [place] [facility]  Referring Provider Dr. Assar      Encounter Date: 05/01/2020  Check In:  Session Check In - 05/01/20 0930      Check-In   Supervising physician immediately available to respond to emergencies [facility] MD immediately available    Physician(s) Dr. Nishan    Location [facility]    Staff Present Madison Karch, MS, Exercise Physiologist;Dalton Fletcher, MS, ACSM-CEP, Exercise Physiologist    Virtual Visit No    Medication changes reported     No    Fall or balance concerns reported    No    Tobacco Cessation No Change    Warm-up and Cool-down Performed as group-led instruction    Resistance Training Performed Yes    VAD Patient? No    PAD/SET Patient? No      Pain Assessment   Currently in Pain? No/denies    Multiple Pain Sites No           Capillary Blood Glucose: No results found for this or any previous visit (from the past 24 hour(s)).    Social History   Tobacco Use  Smoking Status Never Smoker  Smokeless Tobacco Never Used    Goals Met:  Independence with exercise equipment Exercise tolerated well No report of cardiac concerns or symptoms Strength training completed today  Goals Unmet:  Not Applicable  Comments: check out 1030   Dr. Jehanzeb Memon is Medical Director for [facility]. 

## 2020-05-03 ENCOUNTER — Encounter (HOSPITAL_COMMUNITY): Payer: Medicare Other

## 2020-05-05 ENCOUNTER — Encounter (HOSPITAL_COMMUNITY): Payer: Medicare Other

## 2020-05-08 ENCOUNTER — Encounter (HOSPITAL_COMMUNITY)
Admission: RE | Admit: 2020-05-08 | Discharge: 2020-05-08 | Disposition: A | Discharge status: Home / Self Care | Payer: Medicare Other | Source: Ambulatory Visit | Attending: Internal Medicine | Admitting: Internal Medicine

## 2020-05-08 ENCOUNTER — Other Ambulatory Visit: Payer: Self-pay

## 2020-05-08 VITALS — Wt 185.0 lb

## 2020-05-08 DIAGNOSIS — I214 Non-ST elevation (NSTEMI) myocardial infarction: Secondary | ICD-10-CM

## 2020-05-08 DIAGNOSIS — Z955 Presence of coronary angioplasty implant and graft: Secondary | ICD-10-CM | POA: Diagnosis not present

## 2020-05-08 NOTE — Progress Notes (Signed)
Daily Session Note  Patient Details  Name: DOCIA KLAR MRN: [id number] Date of Birth: 22-Apr-1956 Referring Provider:   Flowsheet Row CARDIAC REHAB PHASE II ORIENTATION from 02/16/2020 in [place]  Referring Provider Dr. Candis Musa      Encounter Date: 05/08/2020  Check In:  Session Check In - 05/08/20 0930      Check-In   Supervising physician immediately available to respond to emergencies [facility] MD immediately available    Physician(s) Dr. Harl Bowie    Location [facility]    Staff Present Geanie Cooley, RN;Eddie Koc, MS, ACSM-CEP, Exercise Physiologist    Virtual Visit No    Medication changes reported     No    Fall or balance concerns reported    No    Tobacco Cessation No Change    Warm-up and Cool-down Performed as group-led instruction    Resistance Training Performed Yes    VAD Patient? No    PAD/SET Patient? No      Pain Assessment   Currently in Pain? No/denies    Pain Score 0-No pain    Multiple Pain Sites No           Capillary Blood Glucose: No results found for this or any previous visit (from the past 24 hour(s)).    Social History   Tobacco Use  Smoking Status Never Smoker  Smokeless Tobacco Never Used    Goals Met:  Independence with exercise equipment Exercise tolerated well No report of cardiac concerns or symptoms Strength training completed today  Goals Unmet:  Not Applicable  Comments: checkout time is 1030   Dr. Kathie Dike is Medical Director for [facility] Pul[facility] Re[facility].

## 2020-05-10 ENCOUNTER — Encounter (HOSPITAL_COMMUNITY): Payer: Medicare Other

## 2020-05-15 ENCOUNTER — Encounter (HOSPITAL_COMMUNITY): Payer: Medicare Other

## 2020-05-17 ENCOUNTER — Encounter (HOSPITAL_COMMUNITY)
Admission: RE | Admit: 2020-05-17 | Discharge: 2020-05-17 | Disposition: A | Discharge status: Home / Self Care | Payer: Medicare Other | Source: Ambulatory Visit | Attending: Internal Medicine | Admitting: Internal Medicine

## 2020-05-17 ENCOUNTER — Other Ambulatory Visit: Payer: Self-pay

## 2020-05-17 VITALS — Ht 64.0 in | Wt 185.2 lb

## 2020-05-17 DIAGNOSIS — Z955 Presence of coronary angioplasty implant and graft: Secondary | ICD-10-CM | POA: Insufficient documentation

## 2020-05-17 DIAGNOSIS — I214 Non-ST elevation (NSTEMI) myocardial infarction: Secondary | ICD-10-CM | POA: Insufficient documentation

## 2020-05-17 NOTE — Progress Notes (Signed)
Daily Session Note  Patient Details  Name: HELVI ROYALS MRN: [id number] Date of Birth: 1956/03/08 Referring Provider:   Flowsheet Row CARDIAC REHAB PHASE II ORIENTATION from 02/16/2020 in [place]  Referring Provider Dr. Candis Musa      Encounter Date: 05/17/2020  Check In:  Session Check In - 05/17/20 0930      Check-In   Supervising physician immediately available to respond to emergencies [facility] MD immediately available    Physician(s) Dr. Domenic Polite    Location [facility]    Staff Present Hoy Register, MS, ACSM-CEP, Exercise Physiologist;Clavin Ruhlman Wynetta Emery, RN, BSN;Madison Audria Nine, MS, Exercise Physiologist    Virtual Visit No    Medication changes reported     No    Fall or balance concerns reported    No    Tobacco Cessation No Change    Warm-up and Cool-down Performed as group-led instruction    Resistance Training Performed Yes    VAD Patient? No    PAD/SET Patient? No      Pain Assessment   Currently in Pain? No/denies    Pain Score 0-No pain    Multiple Pain Sites No           Capillary Blood Glucose: No results found for this or any previous visit (from the past 24 hour(s)).    Social History   Tobacco Use  Smoking Status Never Smoker  Smokeless Tobacco Never Used    Goals Met:  Independence with exercise equipment Exercise tolerated well No report of cardiac concerns or symptoms Strength training completed today  Goals Unmet:  Not Applicable  Comments: Check out 1030.   Dr. Kathie Dike is Medical Director for [facility] Pul[facility] Re[facility].

## 2020-05-18 NOTE — Progress Notes (Signed)
Discharge Progress Report  Patient Details  Name: Lindsey Mitchell MRN: [id number] Date of Birth: 1955/05/25 Referring Provider:   Flowsheet Row CARDIAC REHAB PHASE II ORIENTATION from 02/16/2020 in [facility] CARDIAC REHABILITATION  Referring Provider Dr. Sharrell Ku       Number of Visits: 32  Reason for Discharge:  Patient reached a stable level of exercise. Patient independent in their exercise. Patient has met program and personal goals.  Smoking History:  Social History   Tobacco Use  Smoking Status Never Smoker  Smokeless Tobacco Never Used    Diagnosis:  NSTEMI (non-ST elevated myocardial infarction) (HCC)  S/P coronary artery stent placement  ADL UCSD:   Initial Exercise Prescription:  Initial Exercise Prescription - 02/16/20 1400      Date of Initial Exercise RX and Referring Provider   Date 02/16/20    Referring Provider Dr. Sharrell Ku    Expected Discharge Date 05/10/20      Treadmill   MPH 2.2    Grade 0    Minutes 17    METs 2.68      NuStep   Level 2    SPM 80    Minutes 22      Prescription Details   Frequency (times per week) 2    Duration Progress to 30 minutes of continuous aerobic without signs/symptoms of physical distress      Intensity   THRR 40-80% of Max Heartrate 62-123    Ratings of Perceived Exertion 11-13    Perceived Dyspnea 0-4      Resistance Training   Training Prescription Yes    Weight 2 lbs    Reps 10-15           Discharge Exercise Prescription (Final Exercise Prescription Changes):  Exercise Prescription Changes - 05/08/20 0930      Response to Exercise   Blood Pressure (Admit) 142/72    Blood Pressure (Exercise) 166/62    Blood Pressure (Exit) 118/70    Heart Rate (Admit) 72 bpm    Heart Rate (Exercise) 113 bpm    Heart Rate (Exit) 78 bpm    Rating of Perceived Exertion (Exercise) 13    Duration Continue with 30 min of aerobic exercise without signs/symptoms of physical distress.    Intensity THRR unchanged       Progression   Progression Continue to progress workloads to maintain intensity without signs/symptoms of physical distress.      Resistance Training   Training Prescription Yes    Weight 4 lbs    Reps 10-15    Time 10 Minutes      Treadmill   MPH 2.7    Grade 2    Minutes 22    METs 3.81      NuStep   Level 5    SPM 113    Minutes 17    METs 3.8           Functional Capacity:  6 Minute Walk    Row Name 02/16/20 1406 05/17/20 1041       6 Minute Walk   Phase Initial Discharge    Distance 1550 feet 1700 feet    Walk Time 6 minutes 6 minutes    # of Rest Breaks 0 0    MPH 2.94 3.2    METS 3.35 3.82    RPE 11 11    VO2 Peak 11.73 13.36    Symptoms No No    Resting HR 67 bpm 80 bpm    Resting  BP 150/70 144/62    Resting Oxygen Saturation  97 % 97 %    Exercise Oxygen Saturation  during 6 min walk 98 % 96 %    Max Ex. HR 91 bpm 101 bpm    Max Ex. BP 154/62 168/60    2 Minute Post BP 148/66 138/68           Psychological, QOL, Others - Outcomes: PHQ 2/9: Depression screen PHQ 2/9 02/16/2020  Decreased Interest 0  Down, Depressed, Hopeless 0  PHQ - 2 Score 0  Altered sleeping 0  Tired, decreased energy 0  Change in appetite 0  Feeling bad or failure about yourself  0  Trouble concentrating 0  Moving slowly or fidgety/restless 0  Suicidal thoughts 0  PHQ-9 Score 0    Quality of Life:  Quality of Life - 02/16/20 1405      Quality of Life   Select Quality of Life      Quality of Life Scores   Health/Function Pre 20.8 %    Socioeconomic Pre 25.14 %    Psych/Spiritual Pre 23.14 %    Family Pre 25.2 %    GLOBAL Pre 22.82 %           Personal Goals: Goals established at orientation with interventions provided to work toward goal.  Personal Goals and Risk Factors at Admission - 02/16/20 1423      Core Components/Risk Factors/Patient Goals on Admission    Weight Management Yes    Intervention Weight Management: Develop a combined  nutrition and exercise program designed to reach desired caloric intake, while maintaining appropriate intake of nutrient and fiber, sodium and fats, and appropriate energy expenditure required for the weight goal.;Weight Management: Provide education and appropriate resources to help participant work on and attain dietary goals.;Weight Management/Obesity: Establish reasonable short term and long term weight goals.;Obesity: Provide education and appropriate resources to help participant work on and attain dietary goals.    Admit Weight 183 lb 7.7 oz (83.2 kg)    Goal Weight: Short Term 178 lb 7.7 oz (81 kg)    Goal Weight: Long Term 173 lb (78.5 kg)    Expected Outcomes Weight Loss: Understanding of general recommendations for a balanced deficit meal plan, which promotes 1-2 lb weight loss per week and includes a negative energy balance of [phone] kcal/d    Personal Goal Other Yes    Personal Goal Lose 10 lbs in the program. Return to doing what she was doing before her event. Be able to know what she can do. Be active again.    Intervention Patient will attend CR 3 days/week and supplement with exercise at home 3 days/week.    Expected Outcomes Patient will meet both program and personal goals.            Personal Goals Discharge:  Goals and Risk Factor Review    Row Name 03/06/20 1345 03/27/20 0845 04/21/20 1551 05/18/20 1445       Core Components/Risk Factors/Patient Goals Review   Personal Goals Review - - - Other    Review Patient has completed 9 sessions maintaining her weight since last 30 day review. She is doing very well in the program working very hard with progressions and consistent attendance. Her personal goals are to lose 10 lbs; return to doing what she was before her NSTEMI/Stent placement and to know what she can do. She is making good progress toward meeitng her goals. Will continue to monitor. Patient has had  17 sessions losing 1 lb since last 30 day review. She continues  to do very well in the program with progression and consistent attendance. Her blood pressure is at time elevated. She says she does not know where her cardiologist wants her b/p. We plan to send a progress reprots with her to her next appointment. Her personal goals are to lose 10 lbs; return to doing what she was before; know what she can do. Will continue to monitor for progress. Patient has completed 26 sessions losing 1 lb since last 30 day review. She continues to do very well in the program with progression and consistent attendance. Her blood pressure continues to fluctuate being hypertensive at times. Her heart rate during exercise has decreased even with progressions. She saw her cardiologist 03/21/20 with a BP of 138/80. He did not make any medication changes. She reports getting stronger and being able to do more at home. Her personal goals are to lose 10 lbs; return to doing what she was doing before and know what she can do. She feels she is meeting these goals. Will continue to monitor for progress. Patient completed the program with 32 sessions. Her personal goal was to get back to riding a bike and to get back to doing activities she was doing before her event. She has been able to ride a bike.    Expected Outcomes Patient will complete the program meeting both personal and program goals. Patient will complete the program meeting both personal and program goals. Patient will complete the program meeting both personal and program goals. -           Exercise Goals and Review:  Exercise Goals    Row Name 02/16/20 1427 03/06/20 1506 04/24/20 1357         Exercise Goals   Increase Physical Activity Yes Yes Yes     Intervention Provide advice, education, support and counseling about physical activity/exercise needs.;Develop an individualized exercise prescription for aerobic and resistive training based on initial evaluation findings, risk stratification, comorbidities and participant's  personal goals. Provide advice, education, support and counseling about physical activity/exercise needs.;Develop an individualized exercise prescription for aerobic and resistive training based on initial evaluation findings, risk stratification, comorbidities and participant's personal goals. Provide advice, education, support and counseling about physical activity/exercise needs.;Develop an individualized exercise prescription for aerobic and resistive training based on initial evaluation findings, risk stratification, comorbidities and participant's personal goals.     Expected Outcomes Short Term: Attend rehab on a regular basis to increase amount of physical activity.;Long Term: Exercising regularly at least 3-5 days a week.;Long Term: Add in home exercise to make exercise part of routine and to increase amount of physical activity. Short Term: Attend rehab on a regular basis to increase amount of physical activity.;Long Term: Exercising regularly at least 3-5 days a week.;Long Term: Add in home exercise to make exercise part of routine and to increase amount of physical activity. Short Term: Attend rehab on a regular basis to increase amount of physical activity.;Long Term: Add in home exercise to make exercise part of routine and to increase amount of physical activity.;Long Term: Exercising regularly at least 3-5 days a week.     Increase Strength and Stamina Yes Yes Yes     Intervention Provide advice, education, support and counseling about physical activity/exercise needs.;Develop an individualized exercise prescription for aerobic and resistive training based on initial evaluation findings, risk stratification, comorbidities and participant's personal goals. Provide advice, education, support and counseling about  physical activity/exercise needs.;Develop an individualized exercise prescription for aerobic and resistive training based on initial evaluation findings, risk stratification, comorbidities  and participant's personal goals. Provide advice, education, support and counseling about physical activity/exercise needs.;Develop an individualized exercise prescription for aerobic and resistive training based on initial evaluation findings, risk stratification, comorbidities and participant's personal goals.     Expected Outcomes Short Term: Increase workloads from initial exercise prescription for resistance, speed, and METs.;Short Term: Perform resistance training exercises routinely during rehab and add in resistance training at home;Long Term: Improve cardiorespiratory fitness, muscular endurance and strength as measured by increased METs and functional capacity ( ) Short Term: Increase workloads from initial exercise prescription for resistance, speed, and METs.;Short Term: Perform resistance training exercises routinely during rehab and add in resistance training at home;Long Term: Improve cardiorespiratory fitness, muscular endurance and strength as measured by increased METs and functional capacity ( ) Short Term: Increase workloads from initial exercise prescription for resistance, speed, and METs.;Short Term: Perform resistance training exercises routinely during rehab and add in resistance training at home;Long Term: Improve cardiorespiratory fitness, muscular endurance and strength as measured by increased METs and functional capacity ( )     Able to understand and use rate of perceived exertion (RPE) scale Yes Yes Yes     Intervention Provide education and explanation on how to use RPE scale Provide education and explanation on how to use RPE scale Provide education and explanation on how to use RPE scale     Expected Outcomes Short Term: Able to use RPE daily in rehab to express subjective intensity level;Long Term:  Able to use RPE to guide intensity level when exercising independently Short Term: Able to use RPE daily in rehab to express subjective intensity level;Long Term:  Able to  use RPE to guide intensity level when exercising independently Short Term: Able to use RPE daily in rehab to express subjective intensity level;Long Term:  Able to use RPE to guide intensity level when exercising independently     Knowledge and understanding of Target Heart Rate Range (THRR) Yes Yes Yes     Intervention Provide education and explanation of THRR including how the numbers were predicted and where they are located for reference Provide education and explanation of THRR including how the numbers were predicted and where they are located for reference Provide education and explanation of THRR including how the numbers were predicted and where they are located for reference     Expected Outcomes Short Term: Able to state/look up THRR;Long Term: Able to use THRR to govern intensity when exercising independently;Short Term: Able to use daily as guideline for intensity in rehab Short Term: Able to state/look up THRR;Long Term: Able to use THRR to govern intensity when exercising independently;Short Term: Able to use daily as guideline for intensity in rehab Short Term: Able to state/look up THRR;Long Term: Able to use THRR to govern intensity when exercising independently;Short Term: Able to use daily as guideline for intensity in rehab     Able to check pulse independently Yes Yes Yes     Intervention Provide education and demonstration on how to check pulse in carotid and radial arteries.;Review the importance of being able to check your own pulse for safety during independent exercise Provide education and demonstration on how to check pulse in carotid and radial arteries.;Review the importance of being able to check your own pulse for safety during independent exercise Provide education and demonstration on how to check pulse in carotid and radial arteries.;Review the importance  of being able to check your own pulse for safety during independent exercise     Expected Outcomes Short Term: Able to  explain why pulse checking is important during independent exercise;Long Term: Able to check pulse independently and accurately Short Term: Able to explain why pulse checking is important during independent exercise;Long Term: Able to check pulse independently and accurately Short Term: Able to explain why pulse checking is important during independent exercise;Long Term: Able to check pulse independently and accurately     Understanding of Exercise Prescription Yes Yes Yes     Intervention Provide education, explanation, and written materials on patient's individual exercise prescription Provide education, explanation, and written materials on patient's individual exercise prescription Provide education, explanation, and written materials on patient's individual exercise prescription     Expected Outcomes Short Term: Able to explain program exercise prescription;Long Term: Able to explain home exercise prescription to exercise independently Short Term: Able to explain program exercise prescription;Long Term: Able to explain home exercise prescription to exercise independently Short Term: Able to explain program exercise prescription;Long Term: Able to explain home exercise prescription to exercise independently            Exercise Goals Re-Evaluation:  Exercise Goals Re-Evaluation    [place] Name 03/06/20 1506 04/24/20 1358           Exercise Goal Re-Evaluation   Exercise Goals Review Increase Physical Activity;Increase Strength and Stamina;Able to understand and use rate of perceived exertion (RPE) scale;Knowledge and understanding of Target Heart Rate Range (THRR);Able to check pulse independently;Understanding of Exercise Prescription Increase Physical Activity;Increase Strength and Stamina;Able to understand and use rate of perceived exertion (RPE) scale;Knowledge and understanding of Target Heart Rate Range (THRR);Able to check pulse independently;Understanding of Exercise Prescription       Comments Pt has attended 8 exercise sessions. 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 We often have to slow her down for going over her target heart rate. She does aqua aerobics classes multiple times a week on her days away from rehab. She currently exercises at 5.4 METs on the stepper. Will continue to monitor and progress as able. Patient has completed 26 exercise sessions. She is progressing well and tolerating exercise well. She feels she is getting stronger and enjoys coming to rehab. She is currently exercising at 4.9 METs on the NuStep.      Expected Outcomes Through exercise at rehab and by engaging in a home exercise plan, the patient will be able to reach their goals. Through exercising at rehab and a home exercise program, patient will achieve their goals.             Nutrition & Weight - Outcomes:  Pre Biometrics - 05/17/20 1042      Pre Biometrics   Height $Remov'5\' 4"'IemArz$  (1.626 m)    Weight 84 kg    Waist Circumference 36.5 inches    Hip Circumference 43.5 inches    Waist to Hip Ratio 0.84 %    BMI (Calculated) 31.77    Triceps Skinfold 18 mm    % Body Fat 28.6 %    Grip Strength 31.9 kg    Flexibility 30 in    Single Leg Stand 13 seconds            Nutrition:  Nutrition Therapy & Goals - 04/21/20 1550      Personal Nutrition Goals   Comments Will continue to provide education through hand-outs regarding nutrition and healthier choices.  Intervention Plan   Intervention Nutrition handout(s) given to patient.           Nutrition Discharge:  Nutrition Assessments - 05/18/20 1443      MEDFICTS Scores   Pre Score 36    Post Score 18    Score Difference -18           Education Questionnaire Score:  Knowledge Questionnaire Score - 05/18/20 1441      Knowledge Questionnaire Score   Pre Score 22/24    Post Score 21/24          Patient graduated from [place] today on 05-17-2020 after completing 32 sessions. They achieved LTG  of 30 minutes of aerobic exercise at Max Met level of 3.82. All patients vitals are WNL. Discharge instruction has been reviewed in detail and patient stated an understanding of material given. Patient plans to exercise at the [facility]. Cardiac Rehab staff will make f/u call. Patient had no complaints of any abnormal S/S or pain on their exit visit.     Goals reviewed with patient; copy given to patient.

## 2020-05-26 DIAGNOSIS — Z789 Other specified health status: Secondary | ICD-10-CM | POA: Diagnosis not present

## 2020-05-26 DIAGNOSIS — R109 Unspecified abdominal pain: Secondary | ICD-10-CM | POA: Diagnosis not present

## 2020-05-26 DIAGNOSIS — D692 Other nonthrombocytopenic purpura: Secondary | ICD-10-CM | POA: Diagnosis not present

## 2020-05-26 DIAGNOSIS — I10 Essential (primary) hypertension: Secondary | ICD-10-CM | POA: Diagnosis not present

## 2020-05-26 DIAGNOSIS — K5792 Diverticulitis of intestine, part unspecified, without perforation or abscess without bleeding: Secondary | ICD-10-CM | POA: Diagnosis not present

## 2020-05-26 DIAGNOSIS — Z299 Encounter for prophylactic measures, unspecified: Secondary | ICD-10-CM | POA: Diagnosis not present

## 2020-06-12 DIAGNOSIS — I10 Essential (primary) hypertension: Secondary | ICD-10-CM | POA: Diagnosis not present

## 2020-06-12 DIAGNOSIS — E559 Vitamin D deficiency, unspecified: Secondary | ICD-10-CM | POA: Diagnosis not present

## 2020-06-12 DIAGNOSIS — E7849 Other hyperlipidemia: Secondary | ICD-10-CM | POA: Diagnosis not present

## 2020-06-20 DIAGNOSIS — E782 Mixed hyperlipidemia: Secondary | ICD-10-CM | POA: Diagnosis not present

## 2020-06-20 DIAGNOSIS — I10 Essential (primary) hypertension: Secondary | ICD-10-CM | POA: Diagnosis not present

## 2020-06-20 DIAGNOSIS — I25118 Atherosclerotic heart disease of native coronary artery with other forms of angina pectoris: Secondary | ICD-10-CM | POA: Diagnosis not present

## 2020-06-20 DIAGNOSIS — I252 Old myocardial infarction: Secondary | ICD-10-CM | POA: Diagnosis not present

## 2020-06-26 DIAGNOSIS — I10 Essential (primary) hypertension: Secondary | ICD-10-CM | POA: Diagnosis not present

## 2020-06-26 DIAGNOSIS — I25119 Atherosclerotic heart disease of native coronary artery with unspecified angina pectoris: Secondary | ICD-10-CM | POA: Diagnosis not present

## 2020-06-26 DIAGNOSIS — Z299 Encounter for prophylactic measures, unspecified: Secondary | ICD-10-CM | POA: Diagnosis not present

## 2020-06-29 DIAGNOSIS — M79671 Pain in right foot: Secondary | ICD-10-CM | POA: Diagnosis not present

## 2020-06-29 DIAGNOSIS — M722 Plantar fascial fibromatosis: Secondary | ICD-10-CM | POA: Diagnosis not present

## 2020-06-29 DIAGNOSIS — M7731 Calcaneal spur, right foot: Secondary | ICD-10-CM | POA: Diagnosis not present

## 2020-07-10 DIAGNOSIS — I10 Essential (primary) hypertension: Secondary | ICD-10-CM | POA: Diagnosis not present

## 2020-07-10 DIAGNOSIS — E782 Mixed hyperlipidemia: Secondary | ICD-10-CM | POA: Diagnosis not present

## 2020-07-20 DIAGNOSIS — I10 Essential (primary) hypertension: Secondary | ICD-10-CM | POA: Diagnosis not present

## 2020-07-20 DIAGNOSIS — E782 Mixed hyperlipidemia: Secondary | ICD-10-CM | POA: Diagnosis not present

## 2020-08-10 DIAGNOSIS — I10 Essential (primary) hypertension: Secondary | ICD-10-CM | POA: Diagnosis not present

## 2020-08-18 DIAGNOSIS — K625 Hemorrhage of anus and rectum: Secondary | ICD-10-CM | POA: Diagnosis not present

## 2020-08-18 DIAGNOSIS — Z299 Encounter for prophylactic measures, unspecified: Secondary | ICD-10-CM | POA: Diagnosis not present

## 2020-08-18 DIAGNOSIS — I25119 Atherosclerotic heart disease of native coronary artery with unspecified angina pectoris: Secondary | ICD-10-CM | POA: Diagnosis not present

## 2020-08-18 DIAGNOSIS — I10 Essential (primary) hypertension: Secondary | ICD-10-CM | POA: Diagnosis not present

## 2020-08-18 DIAGNOSIS — R195 Other fecal abnormalities: Secondary | ICD-10-CM | POA: Diagnosis not present

## 2020-08-24 DIAGNOSIS — D124 Benign neoplasm of descending colon: Secondary | ICD-10-CM | POA: Diagnosis not present

## 2020-08-24 DIAGNOSIS — Z7901 Long term (current) use of anticoagulants: Secondary | ICD-10-CM | POA: Diagnosis not present

## 2020-08-24 DIAGNOSIS — R195 Other fecal abnormalities: Secondary | ICD-10-CM | POA: Diagnosis not present

## 2020-08-24 DIAGNOSIS — I214 Non-ST elevation (NSTEMI) myocardial infarction: Secondary | ICD-10-CM | POA: Diagnosis not present

## 2020-08-24 DIAGNOSIS — R58 Hemorrhage, not elsewhere classified: Secondary | ICD-10-CM | POA: Diagnosis not present

## 2020-08-30 DIAGNOSIS — I10 Essential (primary) hypertension: Secondary | ICD-10-CM | POA: Diagnosis not present

## 2020-08-30 DIAGNOSIS — Z299 Encounter for prophylactic measures, unspecified: Secondary | ICD-10-CM | POA: Diagnosis not present

## 2020-08-30 DIAGNOSIS — K921 Melena: Secondary | ICD-10-CM | POA: Diagnosis not present

## 2020-09-08 DIAGNOSIS — I10 Essential (primary) hypertension: Secondary | ICD-10-CM | POA: Diagnosis not present

## 2020-09-09 DIAGNOSIS — E7849 Other hyperlipidemia: Secondary | ICD-10-CM | POA: Diagnosis not present

## 2020-09-09 DIAGNOSIS — E559 Vitamin D deficiency, unspecified: Secondary | ICD-10-CM | POA: Diagnosis not present

## 2020-09-09 DIAGNOSIS — I10 Essential (primary) hypertension: Secondary | ICD-10-CM | POA: Diagnosis not present

## 2020-09-14 DIAGNOSIS — E7800 Pure hypercholesterolemia, unspecified: Secondary | ICD-10-CM | POA: Diagnosis not present

## 2020-09-14 DIAGNOSIS — I25119 Atherosclerotic heart disease of native coronary artery with unspecified angina pectoris: Secondary | ICD-10-CM | POA: Diagnosis not present

## 2020-09-14 DIAGNOSIS — Z299 Encounter for prophylactic measures, unspecified: Secondary | ICD-10-CM | POA: Diagnosis not present

## 2020-09-14 DIAGNOSIS — I10 Essential (primary) hypertension: Secondary | ICD-10-CM | POA: Diagnosis not present

## 2020-09-29 DIAGNOSIS — I25119 Atherosclerotic heart disease of native coronary artery with unspecified angina pectoris: Secondary | ICD-10-CM | POA: Diagnosis not present

## 2020-09-29 DIAGNOSIS — I10 Essential (primary) hypertension: Secondary | ICD-10-CM | POA: Diagnosis not present

## 2020-09-29 DIAGNOSIS — Z299 Encounter for prophylactic measures, unspecified: Secondary | ICD-10-CM | POA: Diagnosis not present

## 2020-09-29 DIAGNOSIS — L237 Allergic contact dermatitis due to plants, except food: Secondary | ICD-10-CM | POA: Diagnosis not present

## 2020-10-10 DIAGNOSIS — I10 Essential (primary) hypertension: Secondary | ICD-10-CM | POA: Diagnosis not present

## 2020-10-17 DIAGNOSIS — Z7189 Other specified counseling: Secondary | ICD-10-CM | POA: Diagnosis not present

## 2020-10-17 DIAGNOSIS — Z79899 Other long term (current) drug therapy: Secondary | ICD-10-CM | POA: Diagnosis not present

## 2020-10-17 DIAGNOSIS — E7800 Pure hypercholesterolemia, unspecified: Secondary | ICD-10-CM | POA: Diagnosis not present

## 2020-10-17 DIAGNOSIS — Z0000 Encounter for general adult medical examination without abnormal findings: Secondary | ICD-10-CM | POA: Diagnosis not present

## 2020-10-17 DIAGNOSIS — I10 Essential (primary) hypertension: Secondary | ICD-10-CM | POA: Diagnosis not present

## 2020-10-17 DIAGNOSIS — E261 Secondary hyperaldosteronism: Secondary | ICD-10-CM | POA: Diagnosis not present

## 2020-10-17 DIAGNOSIS — R5383 Other fatigue: Secondary | ICD-10-CM | POA: Diagnosis not present

## 2020-10-17 DIAGNOSIS — Z299 Encounter for prophylactic measures, unspecified: Secondary | ICD-10-CM | POA: Diagnosis not present

## 2020-11-06 DIAGNOSIS — Z299 Encounter for prophylactic measures, unspecified: Secondary | ICD-10-CM | POA: Diagnosis not present

## 2020-11-06 DIAGNOSIS — Z789 Other specified health status: Secondary | ICD-10-CM | POA: Diagnosis not present

## 2020-11-06 DIAGNOSIS — I10 Essential (primary) hypertension: Secondary | ICD-10-CM | POA: Diagnosis not present

## 2020-11-06 DIAGNOSIS — L259 Unspecified contact dermatitis, unspecified cause: Secondary | ICD-10-CM | POA: Diagnosis not present

## 2020-11-09 DIAGNOSIS — E559 Vitamin D deficiency, unspecified: Secondary | ICD-10-CM | POA: Diagnosis not present

## 2020-11-09 DIAGNOSIS — E7849 Other hyperlipidemia: Secondary | ICD-10-CM | POA: Diagnosis not present

## 2020-11-09 DIAGNOSIS — I10 Essential (primary) hypertension: Secondary | ICD-10-CM | POA: Diagnosis not present

## 2020-11-14 DIAGNOSIS — E782 Mixed hyperlipidemia: Secondary | ICD-10-CM | POA: Diagnosis not present

## 2020-11-14 DIAGNOSIS — I25118 Atherosclerotic heart disease of native coronary artery with other forms of angina pectoris: Secondary | ICD-10-CM | POA: Diagnosis not present

## 2020-11-14 DIAGNOSIS — I10 Essential (primary) hypertension: Secondary | ICD-10-CM | POA: Diagnosis not present

## 2020-12-06 DIAGNOSIS — Z79899 Other long term (current) drug therapy: Secondary | ICD-10-CM | POA: Diagnosis not present

## 2020-12-06 DIAGNOSIS — K5730 Diverticulosis of large intestine without perforation or abscess without bleeding: Secondary | ICD-10-CM | POA: Diagnosis not present

## 2020-12-06 DIAGNOSIS — R059 Cough, unspecified: Secondary | ICD-10-CM | POA: Diagnosis not present

## 2020-12-06 DIAGNOSIS — M5136 Other intervertebral disc degeneration, lumbar region: Secondary | ICD-10-CM | POA: Diagnosis not present

## 2020-12-06 DIAGNOSIS — R109 Unspecified abdominal pain: Secondary | ICD-10-CM | POA: Diagnosis not present

## 2020-12-06 DIAGNOSIS — J9811 Atelectasis: Secondary | ICD-10-CM | POA: Diagnosis not present

## 2020-12-06 DIAGNOSIS — N200 Calculus of kidney: Secondary | ICD-10-CM | POA: Diagnosis not present

## 2020-12-06 DIAGNOSIS — M810 Age-related osteoporosis without current pathological fracture: Secondary | ICD-10-CM | POA: Diagnosis not present

## 2020-12-06 DIAGNOSIS — R509 Fever, unspecified: Secondary | ICD-10-CM | POA: Diagnosis not present

## 2020-12-06 DIAGNOSIS — I252 Old myocardial infarction: Secondary | ICD-10-CM | POA: Diagnosis not present

## 2020-12-06 DIAGNOSIS — G4730 Sleep apnea, unspecified: Secondary | ICD-10-CM | POA: Diagnosis not present

## 2020-12-06 DIAGNOSIS — Z9889 Other specified postprocedural states: Secondary | ICD-10-CM | POA: Diagnosis not present

## 2020-12-06 DIAGNOSIS — Z7982 Long term (current) use of aspirin: Secondary | ICD-10-CM | POA: Diagnosis not present

## 2020-12-06 DIAGNOSIS — I2510 Atherosclerotic heart disease of native coronary artery without angina pectoris: Secondary | ICD-10-CM | POA: Diagnosis not present

## 2020-12-06 DIAGNOSIS — I10 Essential (primary) hypertension: Secondary | ICD-10-CM | POA: Diagnosis not present

## 2020-12-06 DIAGNOSIS — E7800 Pure hypercholesterolemia, unspecified: Secondary | ICD-10-CM | POA: Diagnosis not present

## 2020-12-06 DIAGNOSIS — M4697 Unspecified inflammatory spondylopathy, lumbosacral region: Secondary | ICD-10-CM | POA: Diagnosis not present

## 2020-12-08 DIAGNOSIS — I10 Essential (primary) hypertension: Secondary | ICD-10-CM | POA: Diagnosis not present

## 2020-12-08 DIAGNOSIS — Z299 Encounter for prophylactic measures, unspecified: Secondary | ICD-10-CM | POA: Diagnosis not present

## 2020-12-08 DIAGNOSIS — U071 COVID-19: Secondary | ICD-10-CM | POA: Diagnosis not present

## 2020-12-10 DIAGNOSIS — I10 Essential (primary) hypertension: Secondary | ICD-10-CM | POA: Diagnosis not present

## 2020-12-10 DIAGNOSIS — E559 Vitamin D deficiency, unspecified: Secondary | ICD-10-CM | POA: Diagnosis not present

## 2020-12-10 DIAGNOSIS — E7849 Other hyperlipidemia: Secondary | ICD-10-CM | POA: Diagnosis not present

## 2020-12-14 DIAGNOSIS — I10 Essential (primary) hypertension: Secondary | ICD-10-CM | POA: Diagnosis not present

## 2020-12-14 DIAGNOSIS — Z299 Encounter for prophylactic measures, unspecified: Secondary | ICD-10-CM | POA: Diagnosis not present

## 2020-12-14 DIAGNOSIS — W57XXXA Bitten or stung by nonvenomous insect and other nonvenomous arthropods, initial encounter: Secondary | ICD-10-CM | POA: Diagnosis not present

## 2021-01-05 DIAGNOSIS — Z79899 Other long term (current) drug therapy: Secondary | ICD-10-CM | POA: Diagnosis not present

## 2021-01-05 DIAGNOSIS — I252 Old myocardial infarction: Secondary | ICD-10-CM | POA: Diagnosis not present

## 2021-01-05 DIAGNOSIS — I10 Essential (primary) hypertension: Secondary | ICD-10-CM | POA: Diagnosis not present

## 2021-01-05 DIAGNOSIS — D120 Benign neoplasm of cecum: Secondary | ICD-10-CM | POA: Diagnosis not present

## 2021-01-05 DIAGNOSIS — K5730 Diverticulosis of large intestine without perforation or abscess without bleeding: Secondary | ICD-10-CM | POA: Diagnosis not present

## 2021-01-05 DIAGNOSIS — E7800 Pure hypercholesterolemia, unspecified: Secondary | ICD-10-CM | POA: Diagnosis not present

## 2021-01-05 DIAGNOSIS — Z7982 Long term (current) use of aspirin: Secondary | ICD-10-CM | POA: Diagnosis not present

## 2021-01-05 DIAGNOSIS — I2510 Atherosclerotic heart disease of native coronary artery without angina pectoris: Secondary | ICD-10-CM | POA: Diagnosis not present

## 2021-01-05 DIAGNOSIS — R195 Other fecal abnormalities: Secondary | ICD-10-CM | POA: Diagnosis not present

## 2021-01-05 DIAGNOSIS — Z880 Allergy status to penicillin: Secondary | ICD-10-CM | POA: Diagnosis not present

## 2021-01-05 DIAGNOSIS — D127 Benign neoplasm of rectosigmoid junction: Secondary | ICD-10-CM | POA: Diagnosis not present

## 2021-01-05 DIAGNOSIS — Z7902 Long term (current) use of antithrombotics/antiplatelets: Secondary | ICD-10-CM | POA: Diagnosis not present

## 2021-01-10 DIAGNOSIS — I10 Essential (primary) hypertension: Secondary | ICD-10-CM | POA: Diagnosis not present

## 2021-01-18 DIAGNOSIS — D126 Benign neoplasm of colon, unspecified: Secondary | ICD-10-CM | POA: Diagnosis not present

## 2021-02-01 DIAGNOSIS — M79671 Pain in right foot: Secondary | ICD-10-CM | POA: Diagnosis not present

## 2021-02-01 DIAGNOSIS — M722 Plantar fascial fibromatosis: Secondary | ICD-10-CM | POA: Diagnosis not present

## 2021-02-01 DIAGNOSIS — M7731 Calcaneal spur, right foot: Secondary | ICD-10-CM | POA: Diagnosis not present

## 2021-02-06 DIAGNOSIS — I2510 Atherosclerotic heart disease of native coronary artery without angina pectoris: Secondary | ICD-10-CM | POA: Diagnosis not present

## 2021-02-06 DIAGNOSIS — Z880 Allergy status to penicillin: Secondary | ICD-10-CM | POA: Diagnosis not present

## 2021-02-06 DIAGNOSIS — K56600 Partial intestinal obstruction, unspecified as to cause: Secondary | ICD-10-CM | POA: Diagnosis not present

## 2021-02-06 DIAGNOSIS — K5730 Diverticulosis of large intestine without perforation or abscess without bleeding: Secondary | ICD-10-CM | POA: Diagnosis not present

## 2021-02-06 DIAGNOSIS — I10 Essential (primary) hypertension: Secondary | ICD-10-CM | POA: Diagnosis not present

## 2021-02-06 DIAGNOSIS — K635 Polyp of colon: Secondary | ICD-10-CM | POA: Diagnosis not present

## 2021-02-06 DIAGNOSIS — K648 Other hemorrhoids: Secondary | ICD-10-CM | POA: Diagnosis not present

## 2021-02-06 DIAGNOSIS — K5731 Diverticulosis of large intestine without perforation or abscess with bleeding: Secondary | ICD-10-CM | POA: Diagnosis not present

## 2021-02-06 DIAGNOSIS — I252 Old myocardial infarction: Secondary | ICD-10-CM | POA: Diagnosis not present

## 2021-02-06 DIAGNOSIS — E7800 Pure hypercholesterolemia, unspecified: Secondary | ICD-10-CM | POA: Diagnosis not present

## 2021-02-06 DIAGNOSIS — K6389 Other specified diseases of intestine: Secondary | ICD-10-CM | POA: Diagnosis not present

## 2021-02-06 DIAGNOSIS — D120 Benign neoplasm of cecum: Secondary | ICD-10-CM | POA: Diagnosis not present

## 2021-02-09 DIAGNOSIS — I10 Essential (primary) hypertension: Secondary | ICD-10-CM | POA: Diagnosis not present

## 2021-02-09 DIAGNOSIS — E7849 Other hyperlipidemia: Secondary | ICD-10-CM | POA: Diagnosis not present

## 2021-02-21 DIAGNOSIS — Z299 Encounter for prophylactic measures, unspecified: Secondary | ICD-10-CM | POA: Diagnosis not present

## 2021-02-21 DIAGNOSIS — K5900 Constipation, unspecified: Secondary | ICD-10-CM | POA: Diagnosis not present

## 2021-02-21 DIAGNOSIS — G4730 Sleep apnea, unspecified: Secondary | ICD-10-CM | POA: Diagnosis not present

## 2021-02-21 DIAGNOSIS — I10 Essential (primary) hypertension: Secondary | ICD-10-CM | POA: Diagnosis not present

## 2021-02-26 ENCOUNTER — Other Ambulatory Visit (HOSPITAL_COMMUNITY): Payer: Self-pay | Admitting: Internal Medicine

## 2021-02-26 DIAGNOSIS — Z1231 Encounter for screening mammogram for malignant neoplasm of breast: Secondary | ICD-10-CM

## 2021-02-28 ENCOUNTER — Ambulatory Visit (HOSPITAL_COMMUNITY)
Admission: RE | Admit: 2021-02-28 | Discharge: 2021-02-28 | Disposition: A | Discharge status: Home / Self Care | Payer: Medicare Other | Source: Ambulatory Visit | Attending: Internal Medicine | Admitting: Internal Medicine

## 2021-02-28 ENCOUNTER — Encounter (HOSPITAL_COMMUNITY): Payer: Self-pay

## 2021-02-28 ENCOUNTER — Other Ambulatory Visit: Payer: Self-pay

## 2021-02-28 DIAGNOSIS — Z1231 Encounter for screening mammogram for malignant neoplasm of breast: Secondary | ICD-10-CM | POA: Diagnosis not present

## 2021-03-06 ENCOUNTER — Other Ambulatory Visit (HOSPITAL_COMMUNITY): Payer: Self-pay | Admitting: Internal Medicine

## 2021-03-06 DIAGNOSIS — R928 Other abnormal and inconclusive findings on diagnostic imaging of breast: Secondary | ICD-10-CM

## 2021-03-09 DIAGNOSIS — I10 Essential (primary) hypertension: Secondary | ICD-10-CM | POA: Diagnosis not present

## 2021-03-09 DIAGNOSIS — R21 Rash and other nonspecific skin eruption: Secondary | ICD-10-CM | POA: Diagnosis not present

## 2021-03-09 DIAGNOSIS — R109 Unspecified abdominal pain: Secondary | ICD-10-CM | POA: Diagnosis not present

## 2021-03-09 DIAGNOSIS — R110 Nausea: Secondary | ICD-10-CM | POA: Diagnosis not present

## 2021-03-09 DIAGNOSIS — Z299 Encounter for prophylactic measures, unspecified: Secondary | ICD-10-CM | POA: Diagnosis not present

## 2021-03-12 DIAGNOSIS — I10 Essential (primary) hypertension: Secondary | ICD-10-CM | POA: Diagnosis not present

## 2021-03-15 ENCOUNTER — Ambulatory Visit (HOSPITAL_COMMUNITY)
Admission: RE | Admit: 2021-03-15 | Discharge: 2021-03-15 | Disposition: A | Discharge status: Home / Self Care | Payer: Medicare Other | Source: Ambulatory Visit | Attending: Internal Medicine | Admitting: Internal Medicine

## 2021-03-15 ENCOUNTER — Other Ambulatory Visit: Payer: Self-pay

## 2021-03-15 DIAGNOSIS — R928 Other abnormal and inconclusive findings on diagnostic imaging of breast: Secondary | ICD-10-CM | POA: Insufficient documentation

## 2021-03-15 DIAGNOSIS — N6312 Unspecified lump in the right breast, upper inner quadrant: Secondary | ICD-10-CM | POA: Diagnosis not present

## 2021-03-15 DIAGNOSIS — Z85828 Personal history of other malignant neoplasm of skin: Secondary | ICD-10-CM | POA: Diagnosis not present

## 2021-03-15 DIAGNOSIS — Z1283 Encounter for screening for malignant neoplasm of skin: Secondary | ICD-10-CM | POA: Diagnosis not present

## 2021-03-15 DIAGNOSIS — Z08 Encounter for follow-up examination after completed treatment for malignant neoplasm: Secondary | ICD-10-CM | POA: Diagnosis not present

## 2021-03-15 DIAGNOSIS — D225 Melanocytic nevi of trunk: Secondary | ICD-10-CM | POA: Diagnosis not present

## 2021-03-15 DIAGNOSIS — R922 Inconclusive mammogram: Secondary | ICD-10-CM | POA: Diagnosis not present

## 2021-04-11 DIAGNOSIS — I10 Essential (primary) hypertension: Secondary | ICD-10-CM | POA: Diagnosis not present

## 2021-05-03 ENCOUNTER — Other Ambulatory Visit: Payer: Self-pay

## 2021-05-03 ENCOUNTER — Encounter: Payer: Self-pay | Admitting: Pulmonary Disease

## 2021-05-03 ENCOUNTER — Ambulatory Visit: Payer: Medicare Other | Admitting: Pulmonary Disease

## 2021-05-03 VITALS — BP 128/74 | HR 64 | Temp 98.0°F | Ht 64.0 in | Wt 193.1 lb

## 2021-05-03 DIAGNOSIS — R0683 Snoring: Secondary | ICD-10-CM | POA: Diagnosis not present

## 2021-05-03 NOTE — Patient Instructions (Signed)
Will arrange for home sleep study Will call to arrange for follow up after sleep study reviewed  

## 2021-05-03 NOTE — Progress Notes (Signed)
[facility]  Chief Complaint  Patient presents with   Follow-up    Has had a HST approx. 5 years ago. Was given a cpap machine but didn't use it because of discomfort. Is wanting to get a new sleep study.     Past Surgical History:  She  has a past surgical history that includes Abdominal hysterectomy; Colonoscopy (04/2014); Small intestine surgery; and Colonoscopy (2007).  Past Medical History:  Diverticulitis, HTN, HLD, Vit D deficiency, Fatty liver, CAD s/p DES  Constitutional:  BP 128/74    Pulse 64    Temp 98 F (36.7 C)    Ht 5\' 4"  (1.626 m)    Wt 193 lb 1.3 oz (87.6 kg)    SpO2 98% Comment: ra   BMI 33.14 kg/m   Brief Summary:  Lindsey Mitchell is a 65 y.o. female with snoring.      Subjective:   She had a sleep study several years ago.  Showed mild to moderate sleep apnea.  She was tried on CPAP, but never could get used to it.  She then had a heart attack and needed heart stenting. She was advised to revisit her issues with sleep apnea.  She snores and will wake up with a gasp.  She gets cramps in her legs, but this can happen during the daytime too.  She has noticed more trouble with her memory and concentration.  She gets tired during the day and can fall asleep when watching TV or working on the computer.  She grinds her teeth and has a mouth guard, but hasn't been using this.  She goes to sleep at 10 pm.  She falls asleep quickly.  She wakes up several times to use the bathroom.  She gets out of bed at 5 am.  She feels tired in the morning.  She denies morning headache.  She does not use anything to help her fall sleep.  She needs to drink a pot of coffee in the morning to get going  She denies sleep walking, sleep talking, or nightmares.  There is no history of restless legs.  She denies sleep hallucinations, sleep paralysis, or cataplexy.  The Epworth score is 16 out of 24.   Physical Exam:   Appearance - well kempt    ENMT - no sinus tenderness, no oral exudate, no LAN, Mallampati 3 airway, no stridor  Respiratory - equal breath sounds bilaterally, no wheezing or rales  CV - s1s2 regular rate and rhythm, no murmurs  Ext - no clubbing, no edema  Skin - no rashes  Psych - normal mood and affect   Sleep Tests:    Cardiac Tests:  Echo 04/19/19 >> EF 60 to 65%, mild LVH, grade 1 DD, mild MR, mild AR  Social History:  She  reports that she has never smoked. She has never used smokeless tobacco. She reports current alcohol use. She reports that she does not use drugs.  Family History:  Her family history includes Heart attack in her brother and sister; Heart failure in her father and mother; Hypertension in her father and another family member; Stroke in her brother.    Discussion:  She has snoring, sleep disruption, apnea, and daytime sleepiness.  She has history of hypertension, and coronary artery disease.  She has history of obstructive sleep apnea. I am concerned she still has sleep apnea and this has progressed.  Assessment/Plan:   Snoring with excessive daytime sleepiness. -  will need to arrange for a home sleep study  Coronary artery disease. - followed by Dr. Cathie Hoops in [facility]  Obesity. - discussed how weight can impact sleep and risk for sleep disordered breathing - discussed options to assist with weight loss: combination of diet modification, cardiovascular and strength training exercises  Cardiovascular risk. - had an extensive discussion regarding the adverse health consequences related to untreated sleep disordered breathing - specifically discussed the risks for hypertension, coronary artery disease, cardiac dysrhythmias, cerebrovascular disease, and diabetes - lifestyle modification discussed  Safe driving practices. - discussed how sleep disruption can increase risk of accidents, particularly when driving - safe driving practices were  discussed  Therapies for obstructive sleep apnea. - if the sleep study shows significant sleep apnea, then various therapies for treatment were reviewed: CPAP, oral appliance, and surgical interventions  Time Spent Involved in Patient Care on Day of Examination:  32 minutes  Follow up:   Patient Instructions  Will arrange for home sleep study Will call to arrange for follow up after sleep study reviewed   Medication List:   Allergies as of 05/03/2021       Reactions   Relafen [nabumetone] Swelling   Lip swelling   Losartan Rash        Medication List        Accurate as of May 03, 2021 11:14 AM. If you have any questions, ask your nurse or doctor.          STOP taking these medications    candesartan-hydrochlorothiazide 16-12.5 MG tablet Commonly known as: ATACAND HCT Stopped by: Chesley Mires, MD       TAKE these medications    aspirin EC 81 MG tablet Take 81 mg by mouth daily. Swallow whole.   atorvastatin 80 MG tablet Commonly known as: LIPITOR Take 80 mg by mouth daily.   candesartan 16 MG tablet Commonly known as: ATACAND Take 16 mg by mouth daily.   cholecalciferol 25 MCG (1000 UNIT) tablet Commonly known as: VITAMIN D3 Take 2,000 Units by mouth daily.   metoprolol succinate 25 MG 24 hr tablet Commonly known as: TOPROL-XL Take 25 mg by mouth daily.   ticagrelor 90 MG Tabs tablet Commonly known as: BRILINTA Take 90 mg by mouth 2 (two) times daily.        Signature:  Chesley Mires, MD [place] Pager - [phone] 05/03/2021, 11:14 AM

## 2021-05-11 DIAGNOSIS — I10 Essential (primary) hypertension: Secondary | ICD-10-CM | POA: Diagnosis not present

## 2021-05-15 DIAGNOSIS — I10 Essential (primary) hypertension: Secondary | ICD-10-CM | POA: Diagnosis not present

## 2021-05-15 DIAGNOSIS — I25118 Atherosclerotic heart disease of native coronary artery with other forms of angina pectoris: Secondary | ICD-10-CM | POA: Diagnosis not present

## 2021-05-15 DIAGNOSIS — E782 Mixed hyperlipidemia: Secondary | ICD-10-CM | POA: Diagnosis not present

## 2021-06-07 DIAGNOSIS — H0288A Meibomian gland dysfunction right eye, upper and lower eyelids: Secondary | ICD-10-CM | POA: Diagnosis not present

## 2021-06-07 DIAGNOSIS — H16141 Punctate keratitis, right eye: Secondary | ICD-10-CM | POA: Diagnosis not present

## 2021-06-10 DIAGNOSIS — I10 Essential (primary) hypertension: Secondary | ICD-10-CM | POA: Diagnosis not present

## 2021-06-12 DIAGNOSIS — E782 Mixed hyperlipidemia: Secondary | ICD-10-CM | POA: Diagnosis not present

## 2021-06-12 DIAGNOSIS — I10 Essential (primary) hypertension: Secondary | ICD-10-CM | POA: Diagnosis not present

## 2021-06-27 DIAGNOSIS — Z789 Other specified health status: Secondary | ICD-10-CM | POA: Diagnosis not present

## 2021-06-27 DIAGNOSIS — I10 Essential (primary) hypertension: Secondary | ICD-10-CM | POA: Diagnosis not present

## 2021-06-27 DIAGNOSIS — Z299 Encounter for prophylactic measures, unspecified: Secondary | ICD-10-CM | POA: Diagnosis not present

## 2021-06-27 DIAGNOSIS — G4733 Obstructive sleep apnea (adult) (pediatric): Secondary | ICD-10-CM | POA: Diagnosis not present

## 2021-06-27 DIAGNOSIS — I25119 Atherosclerotic heart disease of native coronary artery with unspecified angina pectoris: Secondary | ICD-10-CM | POA: Diagnosis not present

## 2021-06-27 DIAGNOSIS — I700 Atherosclerosis of aorta: Secondary | ICD-10-CM | POA: Diagnosis not present

## 2021-07-18 ENCOUNTER — Other Ambulatory Visit: Payer: Self-pay

## 2021-07-18 ENCOUNTER — Ambulatory Visit: Payer: Medicare Other

## 2021-07-18 DIAGNOSIS — G4733 Obstructive sleep apnea (adult) (pediatric): Secondary | ICD-10-CM | POA: Diagnosis not present

## 2021-07-18 DIAGNOSIS — R0683 Snoring: Secondary | ICD-10-CM

## 2021-07-20 ENCOUNTER — Telehealth: Payer: Self-pay | Admitting: Pulmonary Disease

## 2021-07-20 DIAGNOSIS — G4733 Obstructive sleep apnea (adult) (pediatric): Secondary | ICD-10-CM | POA: Diagnosis not present

## 2021-07-20 NOTE — Telephone Encounter (Signed)
HST 07/18/21 >> AHI 10.4, SpO2 low 85% ? ? ?Please inform her that her sleep study shows mild obstructive sleep apnea.  Please arrange for ROV with me or NP to discuss treatment options. ? ? ? ?

## 2021-07-20 NOTE — Telephone Encounter (Signed)
Called and went over HST results with patient. Offered her an appt in [place] office since [facility] office has no availability until May/June. She was willing to go to [place]. Appt with [place] for 3/20 at 9:00. Gave patient [place] office address and she is aware to call if she needs to cancel ?

## 2021-07-30 ENCOUNTER — Encounter: Payer: Self-pay | Admitting: Nurse Practitioner

## 2021-07-30 ENCOUNTER — Other Ambulatory Visit: Payer: Self-pay

## 2021-07-30 ENCOUNTER — Telehealth: Payer: Self-pay | Admitting: Pulmonary Disease

## 2021-07-30 ENCOUNTER — Ambulatory Visit: Payer: Medicare Other | Admitting: Nurse Practitioner

## 2021-07-30 VITALS — BP 118/74 | HR 60 | Temp 97.8°F | Ht 64.0 in | Wt 193.0 lb

## 2021-07-30 DIAGNOSIS — G4733 Obstructive sleep apnea (adult) (pediatric): Secondary | ICD-10-CM | POA: Diagnosis not present

## 2021-07-30 DIAGNOSIS — I10 Essential (primary) hypertension: Secondary | ICD-10-CM

## 2021-07-30 DIAGNOSIS — R5383 Other fatigue: Secondary | ICD-10-CM | POA: Insufficient documentation

## 2021-07-30 DIAGNOSIS — R5382 Chronic fatigue, unspecified: Secondary | ICD-10-CM

## 2021-07-30 NOTE — Assessment & Plan Note (Signed)
Mild OSA with AHI 10.4/hr. Discussed tx options with pt who wanted to try CPAP again. Order sent to DME for new start.  ? ?Patient Instructions  ?Start CPAP auto 5-15 cmH2O every night with nasal mask, minimum of 4-6 hours a night.  ?Change equipment every 30 days or as directed by DME. Wash your tubing with warm soap and water daily, hang to dry. Wash humidifier portion weekly.  ?Maintain clean equipment, as directed by home health agency.  ?Be aware of reduced alertness and do not drive or operate heavy machinery if experiencing this or drowsiness.  ?Exercise encouraged, as tolerated. ?Healthy weight management discussed.  ?Avoid or decrease alcohol consumption and medications that make you more sleepy, if possible. ?Notify if persistent daytime sleepiness occurs even with consistent use of CPAP. ? ?We discussed how untreated sleep apnea puts an individual at risk for cardiac arrhthymias, pulm HTN, DM, stroke and increases their risk for daytime accidents. We also briefly reviewed treatment options including weight loss, side sleeping position, oral appliance, CPAP therapy or referral to ENT for possible surgical options ? ?Follow up in 31-90 days after starting CPAP with Dr. Halford Chessman or Katie Brinklee Cisse,NP. Can be virtual. If symptoms do not improve or worsen, please contact office for sooner follow up or seek emergency care. ? ? ?

## 2021-07-30 NOTE — Progress Notes (Signed)
Reviewed and agree with assessment/plan. ? ? ?Chesley Mires, MD ?[place] ?07/30/2021, 1:01 PM ?Pager:  [phone] ? ?

## 2021-07-30 NOTE — Assessment & Plan Note (Signed)
Possibly r/t mild OSA. Will re-evaluate for improvement after starting on CPAP therapy.  ?

## 2021-07-30 NOTE — Telephone Encounter (Signed)
05/03/21 notes faxed to [facility]. Nothing further needed.  ?

## 2021-07-30 NOTE — Progress Notes (Signed)
? ?'@Patient'$  ID: Lindsey Mitchell, female    DOB: [birth year], 66 y.o.   MRN: [id number] ? ?Chief Complaint  ?Patient presents with  ? Follow-up  ?  She is here to go over HST.   ? ? ?Referring provider: ?Glenda Chroman, MD ? ?HPI: ?66 year old female, never smoker followed for mild obstructive sleep apnea. She is a patient of Dr. Juanetta Gosling and last seen in office on 05/03/2022 for consult. Past medical history significant for diverticulitis, HTN, HLD, vit D deficiency, fatty liver, CAD s/p DES. ? ?TEST/EVENTS:  ?07/18/2021 HST: AHI 10.4, SpO2 low 85% ? ?05/03/2022: Consult with Dr. Halford Chessman for snoring and previous diagnosis of OSA. She had tried CPAP in past but was unable to get used to it. She had a MI requiring stenting and was advised to revisit her OSA diagnosis and treatment. HST ordered.  ? ?07/30/2021: Today - follow up ?Patient presents today for follow up after HST. She reports continued problems with snoring and daytime fatigue. Occasionally wakes up gasping. Intermittent leg cramps that occur some nights and some days. Continues to fall asleep easily without any intervention. Wakes multiple times a night to use the restroom. She gets about 7 hours of sleep a night. Denies any morning headaches, narcolepsy or drowsy driving. Does feel tired when she wakes in the AM. She previously tried CPAP but had issues adjusting to it.  ? ?Allergies  ?Allergen Reactions  ? Penicillins Anaphylaxis  ? Relafen [Nabumetone] Swelling  ?  Lip swelling  ? Losartan Rash  ? ? ?Immunization History  ?Administered Date(s) Administered  ? Fluad Quad(high Dose 65+) 02/10/2021  ? Influenza, High Dose Seasonal PF 02/03/2019  ? Influenza,inj,Quad PF,6+ Mos 02/02/2018  ? Moderna SARS-COV2 Booster Vaccination 05/24/2020  ? Moderna Sars-Covid-2 Vaccination 08/15/2019, 09/21/2019  ? Zoster Recombinat (Shingrix) 02/02/2018, 04/22/2018  ? ? ?Past Medical History:  ?Diagnosis Date  ? Diverticulitis   ? Hypertension   ? ? ?Tobacco History: ?Social  History  ? ?Tobacco Use  ?Smoking Status Never  ?Smokeless Tobacco Never  ? ?Counseling given: Not Answered ? ? ?Outpatient Medications Prior to Visit  ?Medication Sig Dispense Refill  ? aspirin EC 81 MG tablet Take 81 mg by mouth daily. Swallow whole.    ? atorvastatin (LIPITOR) 80 MG tablet Take 80 mg by mouth daily.    ? candesartan (ATACAND) 16 MG tablet Take 16 mg by mouth daily.    ? cholecalciferol (VITAMIN D3) 25 MCG (1000 UNIT) tablet Take 2,000 Units by mouth daily.    ? metoprolol succinate (TOPROL-XL) 25 MG 24 hr tablet Take 25 mg by mouth 2 (two) times daily.    ? ticagrelor (BRILINTA) 90 MG TABS tablet Take 60 mg by mouth 2 (two) times daily.    ? ?No facility-administered medications prior to visit.  ? ? ? ?Review of Systems:  ? ?Constitutional: No weight loss or gain, night sweats, fevers, chills. +daytime fatigue; morning sleepiness ?HEENT: No headaches, difficulty swallowing, tooth/dental problems, or sore throat. No sneezing, itching, ear ache, nasal congestion, or post nasal drip. +snoring ?CV:  No chest pain, orthopnea, PND, swelling in lower extremities, anasarca, dizziness, palpitations, syncope ?Resp: No shortness of breath with exertion or at rest. No excess mucus or change in color of mucus. No productive or non-productive. No hemoptysis. No wheezing.  No chest wall deformity ?MSK:  No joint pain or swelling.  No decreased range of motion.  No back pain. ?Neuro: No dizziness or lightheadedness.  ?Psych: No  depression or anxiety. Mood stable.  ? ? ? ?Physical Exam: ? ?BP 118/74 (BP Location: Left Arm, Patient Position: Sitting, Cuff Size: Normal)   Pulse 60   Temp 97.8 ?F (36.6 ?C) (Oral)   Ht '5\' 4"'$  (1.626 m)   Wt 193 lb (87.5 kg)   SpO2 99%   BMI 33.13 kg/m?  ? ?GEN: Pleasant, interactive, well-appearing; obese; in no acute distress. ?HEENT:  Normocephalic and atraumatic. PERRLA. Sclera white. Nasal turbinates pink, moist and patent bilaterally. No rhinorrhea present. Oropharynx pink  and moist, without exudate or edema. No lesions, ulcerations, or postnasal drip.  ?NECK:  Supple w/ fair ROM. No JVD present. Normal carotid impulses w/o bruits. Thyroid symmetrical with no goiter or nodules palpated. No lymphadenopathy.   ?CV: RRR, no m/r/g, no peripheral edema. Pulses intact, +2 bilaterally. No cyanosis, pallor or clubbing. ?PULMONARY:  Unlabored, regular breathing. Clear bilaterally A&P w/o wheezes/rales/rhonchi. No accessory muscle use. No dullness to percussion. ?GI: BS present and normoactive. Soft, non-tender to palpation.  ?Neuro: A/Ox3. No focal deficits noted.   ?Psych: Normal affect and behavior. Judgement and thought content appropriate.  ? ? ? ?Lab Results: ? ?CBC ?   ?Component Value Date/Time  ? WBC 9.3 02/08/2016 1730  ? RBC 4.60 02/08/2016 1730  ? HGB 14.3 02/08/2016 1730  ? HCT 41.9 02/08/2016 1730  ? PLT 291 02/08/2016 1730  ? MCV 91.1 02/08/2016 1730  ? MCH 31.1 02/08/2016 1730  ? MCHC 34.1 02/08/2016 1730  ? RDW 12.3 02/08/2016 1730  ? LYMPHSABS 1.1 02/08/2016 1730  ? MONOABS 0.6 02/08/2016 1730  ? EOSABS 0.0 02/08/2016 1730  ? BASOSABS 0.0 02/08/2016 1730  ? ? ?BMET ?   ?Component Value Date/Time  ? NA 136 02/08/2016 1730  ? K 3.4 (L) 02/08/2016 1730  ? CL 100 (L) 02/08/2016 1730  ? CO2 26 02/08/2016 1730  ? GLUCOSE 117 (H) 02/08/2016 1730  ? BUN 19 02/08/2016 1730  ? CREATININE 0.73 02/08/2016 1730  ? CALCIUM 9.7 02/08/2016 1730  ? GFRNONAA >60 02/08/2016 1730  ? GFRAA >60 02/08/2016 1730  ? ? ?BNP ?No results found for: BNP ? ? ?Imaging: ? ?No results found. ? ? ? ?No flowsheet data found. ? ?No results found for: NITRICOXIDE ? ? ? ? ? ?Assessment & Plan:  ? ?Obstructive sleep apnea ?Mild OSA with AHI 10.4/hr. Discussed tx options with pt who wanted to try CPAP again. Order sent to DME for new start.  ? ?Patient Instructions  ?Start CPAP auto 5-15 cmH2O every night with nasal mask, minimum of 4-6 hours a night.  ?Change equipment every 30 days or as directed by DME. Wash your  tubing with warm soap and water daily, hang to dry. Wash humidifier portion weekly.  ?Maintain clean equipment, as directed by home health agency.  ?Be aware of reduced alertness and do not drive or operate heavy machinery if experiencing this or drowsiness.  ?Exercise encouraged, as tolerated. ?Healthy weight management discussed.  ?Avoid or decrease alcohol consumption and medications that make you more sleepy, if possible. ?Notify if persistent daytime sleepiness occurs even with consistent use of CPAP. ? ?We discussed how untreated sleep apnea puts an individual at risk for cardiac arrhthymias, pulm HTN, DM, stroke and increases their risk for daytime accidents. We also briefly reviewed treatment options including weight loss, side sleeping position, oral appliance, CPAP therapy or referral to ENT for possible surgical options ? ?Follow up in 31-90 days after starting CPAP with Dr. Halford Chessman or Joellen Jersey  Shemika Robbs,NP. Can be virtual. If symptoms do not improve or worsen, please contact office for sooner follow up or seek emergency care. ? ? ? ?Hypertension ?Well-controlled at OV on current antihypertensive regimen. Follow up with cardiology as scheduled.  ? ?Fatigue ?Possibly r/t mild OSA. Will re-evaluate for improvement after starting on CPAP therapy.  ? ? ?I spent 32 minutes of dedicated to the care of this patient on the date of this encounter to include pre-visit review of records, face-to-face time with the patient discussing conditions above, post visit ordering of testing, clinical documentation with the electronic health record, making appropriate referrals as documented, and communicating necessary findings to members of the patients care team. ? ?Clayton Bibles, NP ?07/30/2021 ? ?Pt aware and understands NP's role.  ? ?

## 2021-07-30 NOTE — Patient Instructions (Signed)
Start CPAP auto 5-15 cmH2O every night with nasal mask, minimum of 4-6 hours a night.  ?Change equipment every 30 days or as directed by DME. Wash your tubing with warm soap and water daily, hang to dry. Wash humidifier portion weekly.  ?Maintain clean equipment, as directed by home health agency.  ?Be aware of reduced alertness and do not drive or operate heavy machinery if experiencing this or drowsiness.  ?Exercise encouraged, as tolerated. ?Healthy weight management discussed.  ?Avoid or decrease alcohol consumption and medications that make you more sleepy, if possible. ?Notify if persistent daytime sleepiness occurs even with consistent use of CPAP. ? ?We discussed how untreated sleep apnea puts an individual at risk for cardiac arrhthymias, pulm HTN, DM, stroke and increases their risk for daytime accidents. We also briefly reviewed treatment options including weight loss, side sleeping position, oral appliance, CPAP therapy or referral to ENT for possible surgical options ? ?Follow up in 31-90 days after starting CPAP with Dr. Halford Chessman or Katie Talik Casique,NP. Can be virtual. If symptoms do not improve or worsen, please contact office for sooner follow up or seek emergency care. ?

## 2021-07-30 NOTE — Assessment & Plan Note (Signed)
Well-controlled at OV on current antihypertensive regimen. Follow up with cardiology as scheduled.  ?

## 2021-08-29 DIAGNOSIS — G4733 Obstructive sleep apnea (adult) (pediatric): Secondary | ICD-10-CM | POA: Diagnosis not present

## 2021-09-09 DIAGNOSIS — I10 Essential (primary) hypertension: Secondary | ICD-10-CM | POA: Diagnosis not present

## 2021-09-09 DIAGNOSIS — E782 Mixed hyperlipidemia: Secondary | ICD-10-CM | POA: Diagnosis not present

## 2021-09-17 DIAGNOSIS — Z0000 Encounter for general adult medical examination without abnormal findings: Secondary | ICD-10-CM | POA: Diagnosis not present

## 2021-09-17 DIAGNOSIS — I10 Essential (primary) hypertension: Secondary | ICD-10-CM | POA: Diagnosis not present

## 2021-09-17 DIAGNOSIS — E261 Secondary hyperaldosteronism: Secondary | ICD-10-CM | POA: Diagnosis not present

## 2021-09-17 DIAGNOSIS — Z789 Other specified health status: Secondary | ICD-10-CM | POA: Diagnosis not present

## 2021-09-17 DIAGNOSIS — Z7189 Other specified counseling: Secondary | ICD-10-CM | POA: Diagnosis not present

## 2021-09-17 DIAGNOSIS — D692 Other nonthrombocytopenic purpura: Secondary | ICD-10-CM | POA: Diagnosis not present

## 2021-09-17 DIAGNOSIS — Z299 Encounter for prophylactic measures, unspecified: Secondary | ICD-10-CM | POA: Diagnosis not present

## 2021-09-19 DIAGNOSIS — H43811 Vitreous degeneration, right eye: Secondary | ICD-10-CM | POA: Diagnosis not present

## 2021-09-19 DIAGNOSIS — Z961 Presence of intraocular lens: Secondary | ICD-10-CM | POA: Diagnosis not present

## 2021-09-19 DIAGNOSIS — H04123 Dry eye syndrome of bilateral lacrimal glands: Secondary | ICD-10-CM | POA: Diagnosis not present

## 2021-09-19 DIAGNOSIS — H524 Presbyopia: Secondary | ICD-10-CM | POA: Diagnosis not present

## 2021-09-19 DIAGNOSIS — H35031 Hypertensive retinopathy, right eye: Secondary | ICD-10-CM | POA: Diagnosis not present

## 2021-09-28 DIAGNOSIS — G4733 Obstructive sleep apnea (adult) (pediatric): Secondary | ICD-10-CM | POA: Diagnosis not present

## 2021-10-17 DIAGNOSIS — M79671 Pain in right foot: Secondary | ICD-10-CM | POA: Diagnosis not present

## 2021-10-17 DIAGNOSIS — M722 Plantar fascial fibromatosis: Secondary | ICD-10-CM | POA: Diagnosis not present

## 2021-10-17 DIAGNOSIS — M7731 Calcaneal spur, right foot: Secondary | ICD-10-CM | POA: Diagnosis not present

## 2021-10-24 DIAGNOSIS — Z79899 Other long term (current) drug therapy: Secondary | ICD-10-CM | POA: Diagnosis not present

## 2021-10-24 DIAGNOSIS — Z0000 Encounter for general adult medical examination without abnormal findings: Secondary | ICD-10-CM | POA: Diagnosis not present

## 2021-10-24 DIAGNOSIS — Z299 Encounter for prophylactic measures, unspecified: Secondary | ICD-10-CM | POA: Diagnosis not present

## 2021-10-24 DIAGNOSIS — M810 Age-related osteoporosis without current pathological fracture: Secondary | ICD-10-CM | POA: Diagnosis not present

## 2021-10-24 DIAGNOSIS — E7800 Pure hypercholesterolemia, unspecified: Secondary | ICD-10-CM | POA: Diagnosis not present

## 2021-10-24 DIAGNOSIS — Z789 Other specified health status: Secondary | ICD-10-CM | POA: Diagnosis not present

## 2021-10-24 DIAGNOSIS — I10 Essential (primary) hypertension: Secondary | ICD-10-CM | POA: Diagnosis not present

## 2021-10-24 DIAGNOSIS — R5383 Other fatigue: Secondary | ICD-10-CM | POA: Diagnosis not present

## 2021-10-29 DIAGNOSIS — G4733 Obstructive sleep apnea (adult) (pediatric): Secondary | ICD-10-CM | POA: Diagnosis not present

## 2021-10-31 DIAGNOSIS — M722 Plantar fascial fibromatosis: Secondary | ICD-10-CM | POA: Diagnosis not present

## 2021-10-31 DIAGNOSIS — M79671 Pain in right foot: Secondary | ICD-10-CM | POA: Diagnosis not present

## 2021-10-31 DIAGNOSIS — M7731 Calcaneal spur, right foot: Secondary | ICD-10-CM | POA: Diagnosis not present

## 2021-11-07 DIAGNOSIS — I499 Cardiac arrhythmia, unspecified: Secondary | ICD-10-CM | POA: Diagnosis not present

## 2021-11-07 DIAGNOSIS — Z79899 Other long term (current) drug therapy: Secondary | ICD-10-CM | POA: Diagnosis not present

## 2021-11-07 DIAGNOSIS — R079 Chest pain, unspecified: Secondary | ICD-10-CM | POA: Diagnosis not present

## 2021-11-07 DIAGNOSIS — R1110 Vomiting, unspecified: Secondary | ICD-10-CM | POA: Diagnosis not present

## 2021-11-07 DIAGNOSIS — R61 Generalized hyperhidrosis: Secondary | ICD-10-CM | POA: Diagnosis not present

## 2021-11-07 DIAGNOSIS — I10 Essential (primary) hypertension: Secondary | ICD-10-CM | POA: Diagnosis not present

## 2021-11-07 DIAGNOSIS — I959 Hypotension, unspecified: Secondary | ICD-10-CM | POA: Diagnosis not present

## 2021-11-07 DIAGNOSIS — I4891 Unspecified atrial fibrillation: Secondary | ICD-10-CM | POA: Diagnosis not present

## 2021-11-07 DIAGNOSIS — Z743 Need for continuous supervision: Secondary | ICD-10-CM | POA: Diagnosis not present

## 2021-11-07 DIAGNOSIS — R1013 Epigastric pain: Secondary | ICD-10-CM | POA: Diagnosis not present

## 2021-11-07 DIAGNOSIS — I252 Old myocardial infarction: Secondary | ICD-10-CM | POA: Diagnosis not present

## 2021-11-07 DIAGNOSIS — K56609 Unspecified intestinal obstruction, unspecified as to partial versus complete obstruction: Secondary | ICD-10-CM | POA: Diagnosis not present

## 2021-11-07 DIAGNOSIS — Z880 Allergy status to penicillin: Secondary | ICD-10-CM | POA: Diagnosis not present

## 2021-11-07 DIAGNOSIS — I480 Paroxysmal atrial fibrillation: Secondary | ICD-10-CM | POA: Diagnosis not present

## 2021-11-07 DIAGNOSIS — R739 Hyperglycemia, unspecified: Secondary | ICD-10-CM | POA: Diagnosis not present

## 2021-11-07 DIAGNOSIS — R109 Unspecified abdominal pain: Secondary | ICD-10-CM | POA: Diagnosis not present

## 2021-11-07 DIAGNOSIS — K565 Intestinal adhesions [bands], unspecified as to partial versus complete obstruction: Secondary | ICD-10-CM | POA: Diagnosis not present

## 2021-11-07 DIAGNOSIS — R0789 Other chest pain: Secondary | ICD-10-CM | POA: Diagnosis not present

## 2021-11-07 DIAGNOSIS — I2510 Atherosclerotic heart disease of native coronary artery without angina pectoris: Secondary | ICD-10-CM | POA: Diagnosis not present

## 2021-11-07 DIAGNOSIS — Z955 Presence of coronary angioplasty implant and graft: Secondary | ICD-10-CM | POA: Diagnosis not present

## 2021-11-07 DIAGNOSIS — E785 Hyperlipidemia, unspecified: Secondary | ICD-10-CM | POA: Diagnosis not present

## 2021-11-07 DIAGNOSIS — R110 Nausea: Secondary | ICD-10-CM | POA: Diagnosis not present

## 2021-11-08 DIAGNOSIS — I2510 Atherosclerotic heart disease of native coronary artery without angina pectoris: Secondary | ICD-10-CM | POA: Diagnosis not present

## 2021-11-08 DIAGNOSIS — I499 Cardiac arrhythmia, unspecified: Secondary | ICD-10-CM | POA: Diagnosis not present

## 2021-11-08 DIAGNOSIS — Z955 Presence of coronary angioplasty implant and graft: Secondary | ICD-10-CM | POA: Diagnosis not present

## 2021-11-08 DIAGNOSIS — I10 Essential (primary) hypertension: Secondary | ICD-10-CM | POA: Diagnosis not present

## 2021-11-08 DIAGNOSIS — K565 Intestinal adhesions [bands], unspecified as to partial versus complete obstruction: Secondary | ICD-10-CM | POA: Diagnosis not present

## 2021-11-08 DIAGNOSIS — I4891 Unspecified atrial fibrillation: Secondary | ICD-10-CM | POA: Diagnosis not present

## 2021-11-09 DIAGNOSIS — I2510 Atherosclerotic heart disease of native coronary artery without angina pectoris: Secondary | ICD-10-CM | POA: Diagnosis not present

## 2021-11-09 DIAGNOSIS — I4891 Unspecified atrial fibrillation: Secondary | ICD-10-CM | POA: Diagnosis not present

## 2021-11-09 DIAGNOSIS — I499 Cardiac arrhythmia, unspecified: Secondary | ICD-10-CM | POA: Diagnosis not present

## 2021-11-09 DIAGNOSIS — K565 Intestinal adhesions [bands], unspecified as to partial versus complete obstruction: Secondary | ICD-10-CM | POA: Diagnosis not present

## 2021-11-16 DIAGNOSIS — M818 Other osteoporosis without current pathological fracture: Secondary | ICD-10-CM | POA: Diagnosis not present

## 2021-11-16 DIAGNOSIS — M859 Disorder of bone density and structure, unspecified: Secondary | ICD-10-CM | POA: Diagnosis not present

## 2021-11-16 DIAGNOSIS — Z79899 Other long term (current) drug therapy: Secondary | ICD-10-CM | POA: Diagnosis not present

## 2021-11-19 ENCOUNTER — Encounter: Payer: Self-pay | Admitting: Pulmonary Disease

## 2021-11-19 ENCOUNTER — Ambulatory Visit: Payer: Medicare Other | Admitting: Pulmonary Disease

## 2021-11-19 VITALS — BP 134/78 | HR 78 | Temp 97.8°F | Ht 64.0 in | Wt 189.2 lb

## 2021-11-19 DIAGNOSIS — I4891 Unspecified atrial fibrillation: Secondary | ICD-10-CM | POA: Diagnosis not present

## 2021-11-19 DIAGNOSIS — I25118 Atherosclerotic heart disease of native coronary artery with other forms of angina pectoris: Secondary | ICD-10-CM | POA: Diagnosis not present

## 2021-11-19 DIAGNOSIS — I10 Essential (primary) hypertension: Secondary | ICD-10-CM | POA: Diagnosis not present

## 2021-11-19 DIAGNOSIS — G4733 Obstructive sleep apnea (adult) (pediatric): Secondary | ICD-10-CM | POA: Diagnosis not present

## 2021-11-19 DIAGNOSIS — E782 Mixed hyperlipidemia: Secondary | ICD-10-CM | POA: Diagnosis not present

## 2021-11-19 NOTE — Patient Instructions (Signed)
Will call you with result of your CPAP report  Follow up in 1 year

## 2021-11-19 NOTE — Progress Notes (Signed)
[name]  Chief Complaint  Patient presents with   Follow-up    Cpap working well. Pt did not have SD card in machine.     Past Surgical History:  She  has a past surgical history that includes Abdominal hysterectomy; Colonoscopy (04/2014); Small intestine surgery; and Colonoscopy (2007).  Past Medical History:  Diverticulitis, HTN, HLD, Vit D deficiency, Fatty liver, CAD s/p DES  Constitutional:  BP 134/78 (BP Location: Left Arm, Patient Position: Sitting)   Pulse 78   Temp 97.8 F (36.6 C) (Temporal)   Ht '5\' 4"'$  (1.626 m)   Wt 189 lb 3.2 oz (85.8 kg)   SpO2 98% Comment: ra  BMI 32.48 kg/m   Brief Summary:  Lindsey Mitchell is a 66 y.o. female with obstructive sleep apnea.      Subjective:   She had home sleep study in March.  Showed mild sleep apnea.  Saw Katy Cobb.  Got set up with auto CPAP through [facility].  She uses nasal pillow mask.  She is a side sleeper, and mask shifts sometimes.  She uses her CPAP for about 6 hours, then goes to the bathroom.  She doesn't always put her CPAP back on after this.  She naps in the afternoon, and sleeps in a recliner then.  She has done this ever since she had COVID.  She otherwise feels her sleep is better and energy level has improved.  Not having sinus congestion or sore throat.   Physical Exam:   Appearance - well kempt   ENMT - no sinus tenderness, no oral exudate, no LAN, Mallampati 4 airway, no stridor  Respiratory - equal breath sounds bilaterally, no wheezing or rales  CV - s1s2 regular rate and rhythm, no murmurs  Ext - no clubbing, no edema  Skin - no rashes  Psych - normal mood and affect    Sleep Tests:  HST 07/18/21 >> AHI 10.4, SpO2 low 85%  Cardiac Tests:  Echo 04/19/19 >> EF 60 to 65%, mild LVH, grade 1 DD, mild MR, mild AR  Social History:  She  reports that she has never smoked. She has never used smokeless tobacco. She reports current  alcohol use. She reports that she does not use drugs.  Family History:  Her family history includes Heart attack in her brother and sister; Heart failure in her father and mother; Hypertension in her father and another family member; Stroke in her brother.     Assessment/Plan:   Obstructive sleep apnea. - she is compliant with CPAP and reports benefit from therapy - she uses [facility] for her DME - current CPAP ordered on 07/30/21 - continue auto CPAP 5 to 15 cm H2O - will call her with results of CPAP download  Coronary artery disease. - followed by Dr. Cathie Hoops in [facility]  Obesity. - discussed how weight can impact sleep and risk for sleep disordered breathing - discussed options to assist with weight loss: combination of diet modification, cardiovascular and strength training exercises  Time Spent Involved in Patient Care on Day of Examination:  26 minutes  Follow up:   Patient Instructions  Will call you with result of your CPAP report  Follow up in 1 year  Medication List:   Allergies as of 11/19/2021       Reactions   Penicillins Anaphylaxis   Relafen [nabumetone] Swelling   Lip swelling   Losartan Rash  Medication List        Accurate as of November 19, 2021  9:22 AM. If you have any questions, ask your nurse or doctor.          aspirin EC 81 MG tablet Take 81 mg by mouth daily. Swallow whole.   atorvastatin 80 MG tablet Commonly known as: LIPITOR Take 80 mg by mouth daily.   candesartan 16 MG tablet Commonly known as: ATACAND Take 16 mg by mouth daily.   cholecalciferol 25 MCG (1000 UNIT) tablet Commonly known as: VITAMIN D3 Take 2,000 Units by mouth daily.   metoprolol succinate 25 MG 24 hr tablet Commonly known as: TOPROL-XL Take 25 mg by mouth 2 (two) times daily.   ticagrelor 90 MG Tabs tablet Commonly known as: BRILINTA Take 60 mg by mouth 2 (two) times daily.        Signature:  Chesley Mires,  MD [place] Pager - [phone] 11/19/2021, 9:22 AM

## 2021-11-21 ENCOUNTER — Telehealth: Payer: Self-pay | Admitting: Pulmonary Disease

## 2021-11-21 NOTE — Telephone Encounter (Signed)
Ov notes faxed 

## 2021-11-26 DIAGNOSIS — I700 Atherosclerosis of aorta: Secondary | ICD-10-CM | POA: Diagnosis not present

## 2021-11-26 DIAGNOSIS — Z299 Encounter for prophylactic measures, unspecified: Secondary | ICD-10-CM | POA: Diagnosis not present

## 2021-11-26 DIAGNOSIS — I25119 Atherosclerotic heart disease of native coronary artery with unspecified angina pectoris: Secondary | ICD-10-CM | POA: Diagnosis not present

## 2021-11-26 DIAGNOSIS — Z09 Encounter for follow-up examination after completed treatment for conditions other than malignant neoplasm: Secondary | ICD-10-CM | POA: Diagnosis not present

## 2021-11-26 DIAGNOSIS — I4891 Unspecified atrial fibrillation: Secondary | ICD-10-CM | POA: Diagnosis not present

## 2021-11-28 DIAGNOSIS — G4733 Obstructive sleep apnea (adult) (pediatric): Secondary | ICD-10-CM | POA: Diagnosis not present

## 2021-12-11 DIAGNOSIS — I4891 Unspecified atrial fibrillation: Secondary | ICD-10-CM | POA: Diagnosis not present

## 2021-12-26 ENCOUNTER — Encounter: Payer: Self-pay | Admitting: *Deleted

## 2021-12-29 DIAGNOSIS — G4733 Obstructive sleep apnea (adult) (pediatric): Secondary | ICD-10-CM | POA: Diagnosis not present

## 2022-01-02 DIAGNOSIS — R079 Chest pain, unspecified: Secondary | ICD-10-CM | POA: Diagnosis not present

## 2022-01-02 DIAGNOSIS — Z880 Allergy status to penicillin: Secondary | ICD-10-CM | POA: Diagnosis not present

## 2022-01-02 DIAGNOSIS — I4891 Unspecified atrial fibrillation: Secondary | ICD-10-CM | POA: Diagnosis not present

## 2022-01-02 DIAGNOSIS — Z955 Presence of coronary angioplasty implant and graft: Secondary | ICD-10-CM | POA: Diagnosis not present

## 2022-01-02 DIAGNOSIS — Z79899 Other long term (current) drug therapy: Secondary | ICD-10-CM | POA: Diagnosis not present

## 2022-01-02 DIAGNOSIS — R001 Bradycardia, unspecified: Secondary | ICD-10-CM | POA: Diagnosis not present

## 2022-01-02 DIAGNOSIS — I2510 Atherosclerotic heart disease of native coronary artery without angina pectoris: Secondary | ICD-10-CM | POA: Diagnosis not present

## 2022-01-02 DIAGNOSIS — I252 Old myocardial infarction: Secondary | ICD-10-CM | POA: Diagnosis not present

## 2022-01-02 DIAGNOSIS — E7800 Pure hypercholesterolemia, unspecified: Secondary | ICD-10-CM | POA: Diagnosis not present

## 2022-01-02 DIAGNOSIS — R0789 Other chest pain: Secondary | ICD-10-CM | POA: Diagnosis not present

## 2022-01-02 DIAGNOSIS — I10 Essential (primary) hypertension: Secondary | ICD-10-CM | POA: Diagnosis not present

## 2022-01-02 DIAGNOSIS — Z7982 Long term (current) use of aspirin: Secondary | ICD-10-CM | POA: Diagnosis not present

## 2022-01-02 DIAGNOSIS — K219 Gastro-esophageal reflux disease without esophagitis: Secondary | ICD-10-CM | POA: Diagnosis not present

## 2022-01-02 DIAGNOSIS — R0602 Shortness of breath: Secondary | ICD-10-CM | POA: Diagnosis not present

## 2022-01-02 DIAGNOSIS — G4730 Sleep apnea, unspecified: Secondary | ICD-10-CM | POA: Diagnosis not present

## 2022-01-08 DIAGNOSIS — K567 Ileus, unspecified: Secondary | ICD-10-CM | POA: Diagnosis not present

## 2022-01-08 DIAGNOSIS — Z789 Other specified health status: Secondary | ICD-10-CM | POA: Diagnosis not present

## 2022-01-08 DIAGNOSIS — K219 Gastro-esophageal reflux disease without esophagitis: Secondary | ICD-10-CM | POA: Diagnosis not present

## 2022-01-08 DIAGNOSIS — Z299 Encounter for prophylactic measures, unspecified: Secondary | ICD-10-CM | POA: Diagnosis not present

## 2022-01-08 DIAGNOSIS — I10 Essential (primary) hypertension: Secondary | ICD-10-CM | POA: Diagnosis not present

## 2022-01-15 DIAGNOSIS — K56609 Unspecified intestinal obstruction, unspecified as to partial versus complete obstruction: Secondary | ICD-10-CM | POA: Diagnosis not present

## 2022-01-15 DIAGNOSIS — K567 Ileus, unspecified: Secondary | ICD-10-CM | POA: Diagnosis not present

## 2022-01-29 DIAGNOSIS — G4733 Obstructive sleep apnea (adult) (pediatric): Secondary | ICD-10-CM | POA: Diagnosis not present

## 2022-02-28 DIAGNOSIS — G4733 Obstructive sleep apnea (adult) (pediatric): Secondary | ICD-10-CM | POA: Diagnosis not present

## 2022-03-31 DIAGNOSIS — G4733 Obstructive sleep apnea (adult) (pediatric): Secondary | ICD-10-CM | POA: Diagnosis not present

## 2022-04-08 DIAGNOSIS — Z789 Other specified health status: Secondary | ICD-10-CM | POA: Diagnosis not present

## 2022-04-08 DIAGNOSIS — Z299 Encounter for prophylactic measures, unspecified: Secondary | ICD-10-CM | POA: Diagnosis not present

## 2022-04-08 DIAGNOSIS — I10 Essential (primary) hypertension: Secondary | ICD-10-CM | POA: Diagnosis not present

## 2022-04-08 DIAGNOSIS — I700 Atherosclerosis of aorta: Secondary | ICD-10-CM | POA: Diagnosis not present

## 2022-04-08 DIAGNOSIS — E261 Secondary hyperaldosteronism: Secondary | ICD-10-CM | POA: Diagnosis not present

## 2022-04-08 DIAGNOSIS — I25119 Atherosclerotic heart disease of native coronary artery with unspecified angina pectoris: Secondary | ICD-10-CM | POA: Diagnosis not present

## 2022-04-24 DIAGNOSIS — J069 Acute upper respiratory infection, unspecified: Secondary | ICD-10-CM | POA: Diagnosis not present

## 2022-04-24 DIAGNOSIS — Z299 Encounter for prophylactic measures, unspecified: Secondary | ICD-10-CM | POA: Diagnosis not present

## 2022-04-24 DIAGNOSIS — R0981 Nasal congestion: Secondary | ICD-10-CM | POA: Diagnosis not present

## 2022-04-30 DIAGNOSIS — G4733 Obstructive sleep apnea (adult) (pediatric): Secondary | ICD-10-CM | POA: Diagnosis not present

## 2022-05-02 DIAGNOSIS — I4891 Unspecified atrial fibrillation: Secondary | ICD-10-CM | POA: Diagnosis not present

## 2022-05-02 DIAGNOSIS — J069 Acute upper respiratory infection, unspecified: Secondary | ICD-10-CM | POA: Diagnosis not present

## 2022-05-02 DIAGNOSIS — Z299 Encounter for prophylactic measures, unspecified: Secondary | ICD-10-CM | POA: Diagnosis not present

## 2022-05-09 DIAGNOSIS — I25119 Atherosclerotic heart disease of native coronary artery with unspecified angina pectoris: Secondary | ICD-10-CM | POA: Diagnosis not present

## 2022-05-09 DIAGNOSIS — R5383 Other fatigue: Secondary | ICD-10-CM | POA: Diagnosis not present

## 2022-05-09 DIAGNOSIS — Z299 Encounter for prophylactic measures, unspecified: Secondary | ICD-10-CM | POA: Diagnosis not present

## 2022-05-09 DIAGNOSIS — I10 Essential (primary) hypertension: Secondary | ICD-10-CM | POA: Diagnosis not present

## 2022-05-09 DIAGNOSIS — I700 Atherosclerosis of aorta: Secondary | ICD-10-CM | POA: Diagnosis not present

## 2022-05-22 DIAGNOSIS — E782 Mixed hyperlipidemia: Secondary | ICD-10-CM | POA: Diagnosis not present

## 2022-05-22 DIAGNOSIS — I25118 Atherosclerotic heart disease of native coronary artery with other forms of angina pectoris: Secondary | ICD-10-CM | POA: Diagnosis not present

## 2022-05-22 DIAGNOSIS — I10 Essential (primary) hypertension: Secondary | ICD-10-CM | POA: Diagnosis not present

## 2022-05-23 DIAGNOSIS — H1131 Conjunctival hemorrhage, right eye: Secondary | ICD-10-CM | POA: Diagnosis not present

## 2022-05-31 DIAGNOSIS — G4733 Obstructive sleep apnea (adult) (pediatric): Secondary | ICD-10-CM | POA: Diagnosis not present

## 2022-07-01 DIAGNOSIS — G4733 Obstructive sleep apnea (adult) (pediatric): Secondary | ICD-10-CM | POA: Diagnosis not present

## 2022-10-22 ENCOUNTER — Other Ambulatory Visit: Payer: Self-pay | Admitting: Internal Medicine

## 2022-10-22 DIAGNOSIS — Z1231 Encounter for screening mammogram for malignant neoplasm of breast: Secondary | ICD-10-CM

## 2022-10-29 IMAGING — US US BREAST*R* LIMITED INC AXILLA
1 series · 13 of 20 positions shown · non-contrast
Comparison: Previous exam(s).

CLINICAL DATA: Patient returns after screening study for evaluation
possible asymmetries in the RIGHT breast and possible asymmetry in
the LEFT breast.

Patient takes blood thinners. Patient's 2 sisters were diagnosed
with breast cancer.
EXAM:
DIGITAL DIAGNOSTIC BILATERAL MAMMOGRAM WITH TOMOSYNTHESIS AND CAD;
ULTRASOUND LEFT BREAST LIMITED; ULTRASOUND RIGHT BREAST LIMITED
TECHNIQUE: Bilateral digital diagnostic mammography and breast tomosynthesis
was performed. The images were evaluated with computer-aided
detection.; Targeted ultrasound examination of the left breast was
performed.; Targeted ultrasound examination of the right breast was
performed

[Series 1: us breast*right* limited inc axilla · 0.06mm/px · 20 acquisitions, 13 frames shown]
[im 1/20]
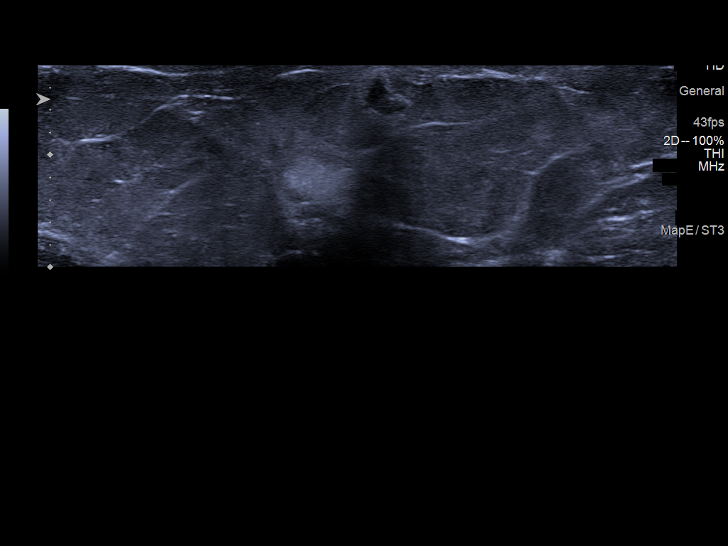
[im 3/20]
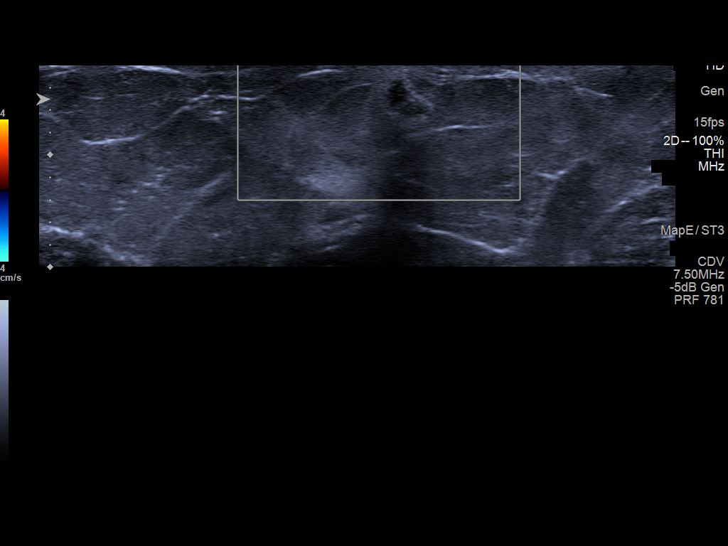
[im 4/20]
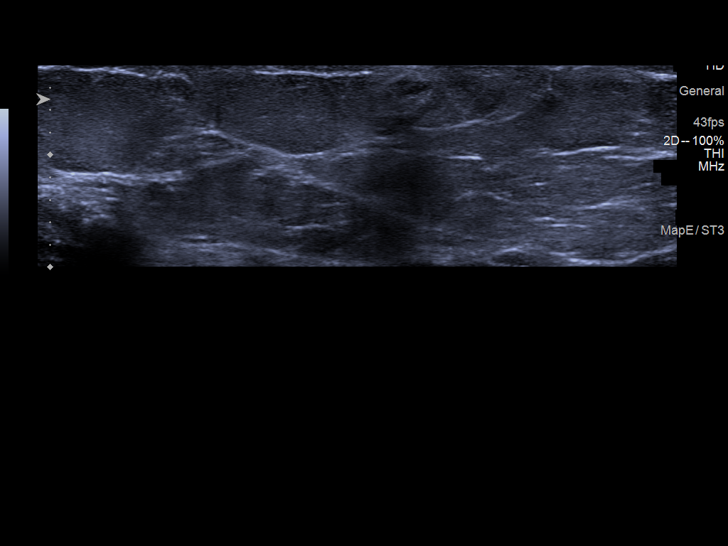
[im 6/20]
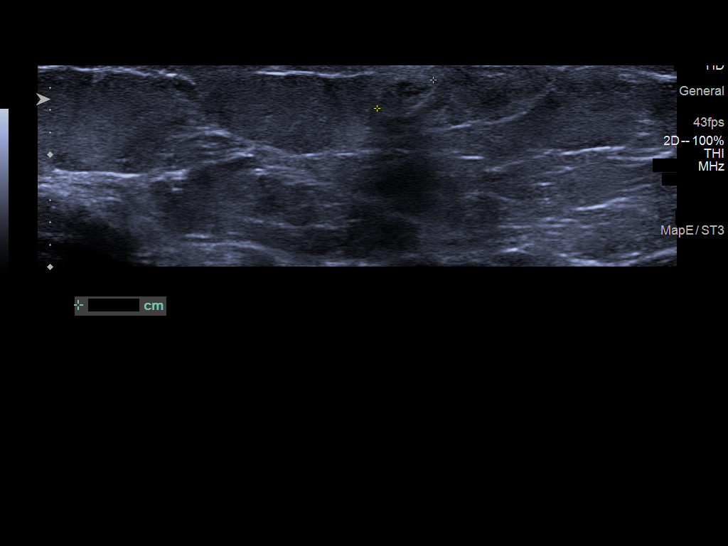
[im 7/20]
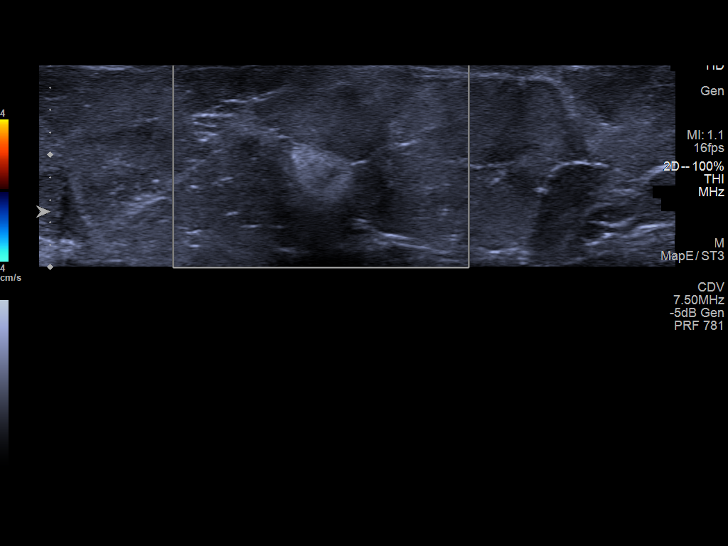
[im 9/20]
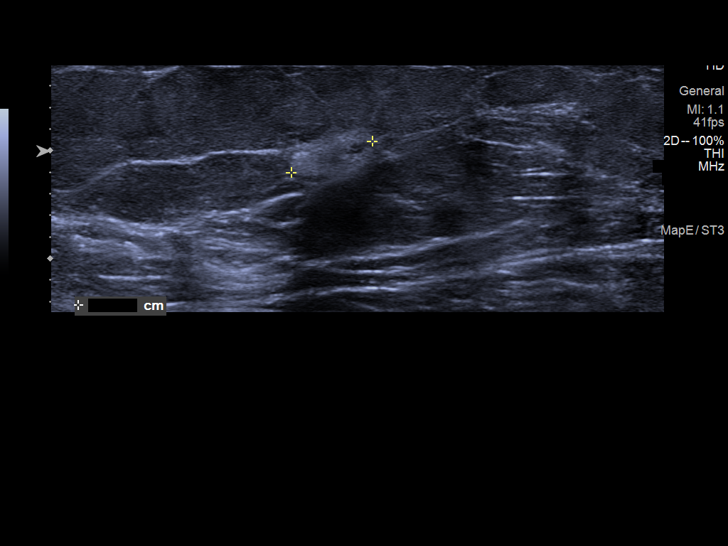
[im 11/20]
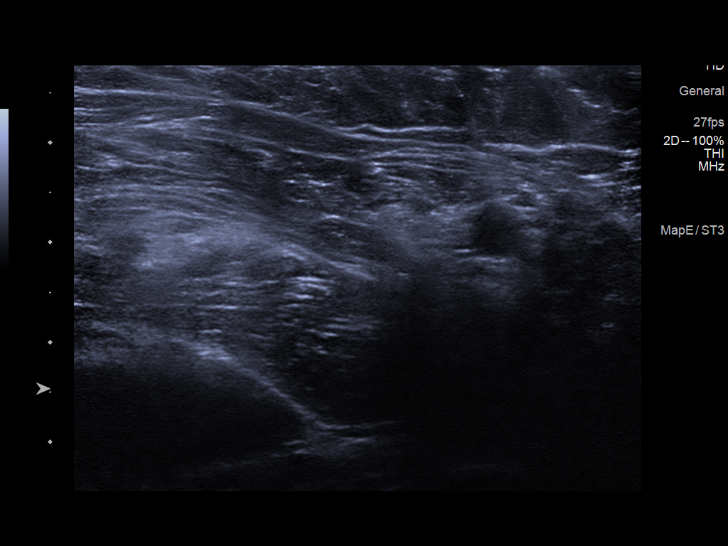
[im 12/20]
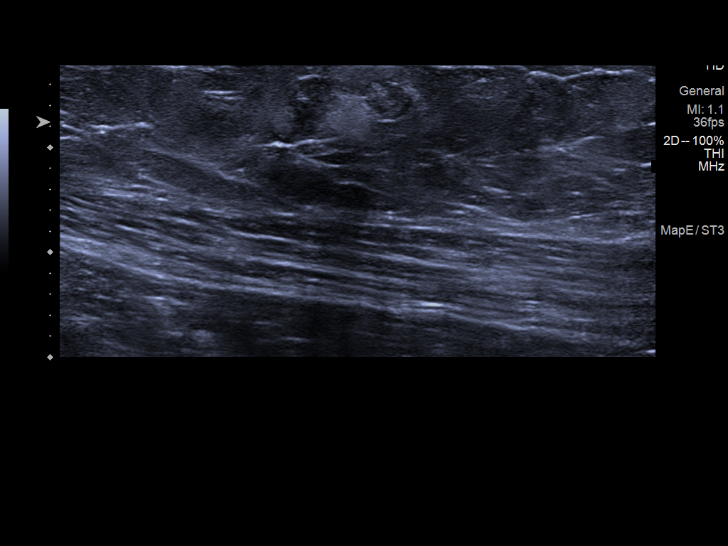
[im 14/20]
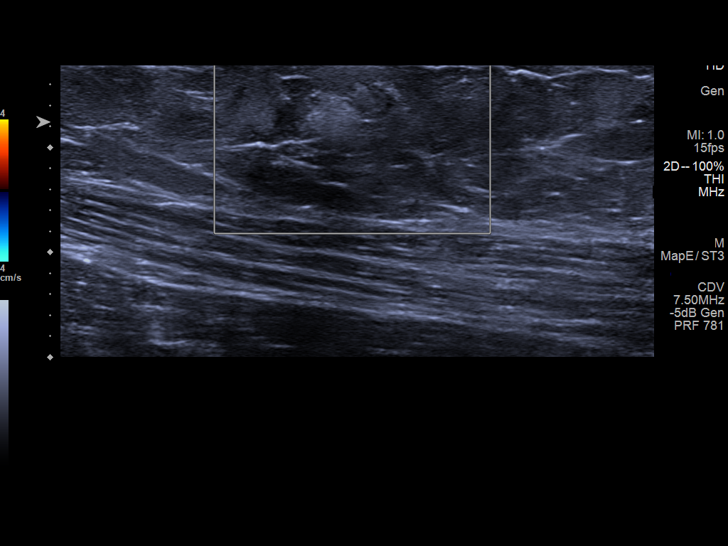
[im 15/20]
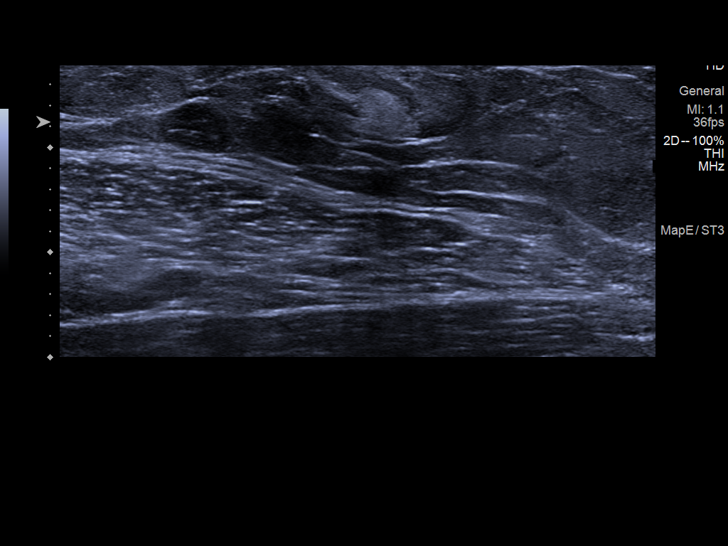
[im 17/20]
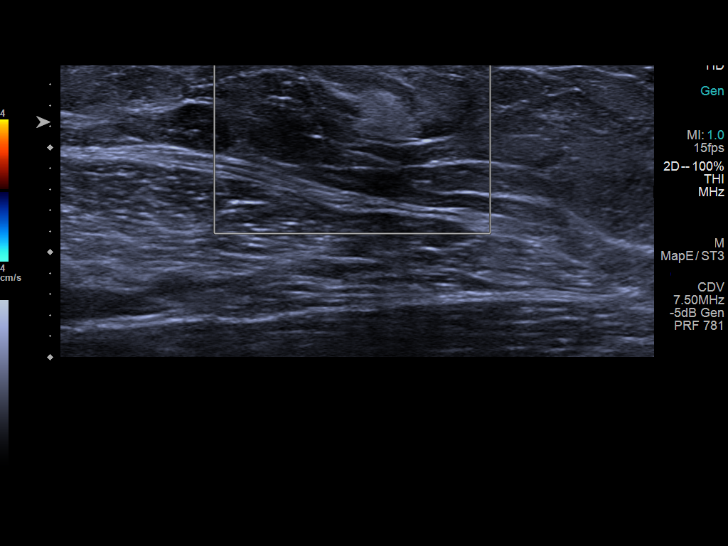
[im 18/20]
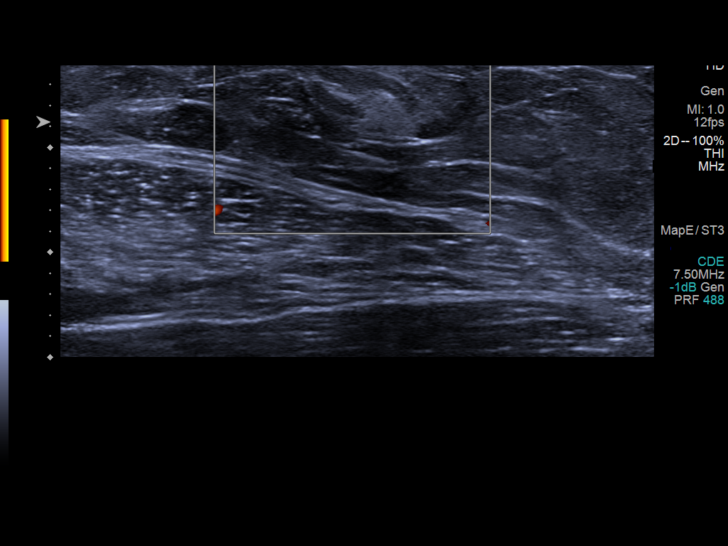
[im 20/20]
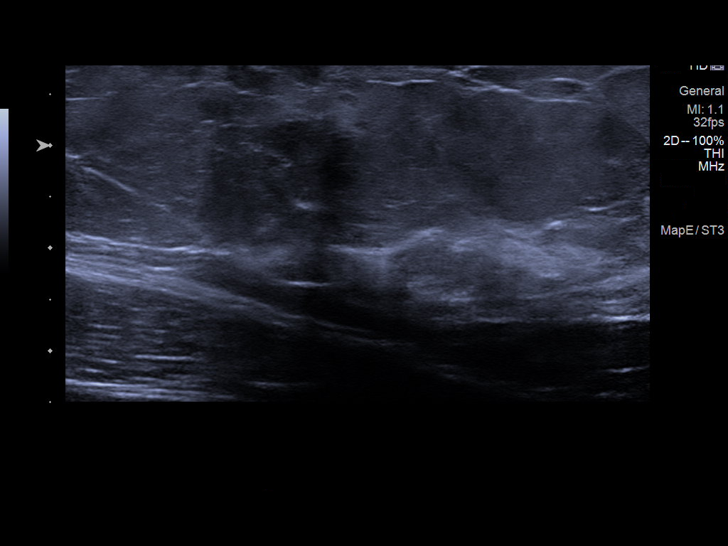

[13 of 20 positions shown; findings below may reference images not displayed]

ACR Breast Density Category c: The breast tissue is heterogeneously
dense, which may obscure small masses.
FINDINGS: RIGHT BREAST:

Mammogram: Additional 2-D and 3-D images are performed. These views
confirm presence of an oval mass with indistinct margins in the
UPPER INNER QUADRANT of the RIGHT breast. Mammographic images were
processed with CAD.

Physical Exam: On physical exam, patient has small bruises across
the RIGHT breast and abdomen. I palpate no mass in the MEDIAL aspect
RIGHT breast.

Ultrasound: Targeted ultrasound is performed, showing a mixed
echogenicity mass in the 2:30 o'clock location of the RIGHT breast 6
centimeters from nipple measuring 0.5 x 0.4 x 0.6 centimeters.

In the 2:30 o'clock location 6 centimeters from nipple, a mixed
echogenicity mass is 0.8 x 0.6 x 0.5 centimeters.

LEFT BREAST:

Mammogram: Additional 2-D and 3-D images are performed. These views
confirm presence of an oval mass with circumscribed margins in the
UPPER central LEFT breast. Mammographic images were processed with
CAD.

Physical Exam: Small bruises are present across the LEFT breast and
UPPER abdomen. I palpate no discrete mass in the UPPER central LEFT
breast.

Ultrasound: Targeted ultrasound is performed, showing a mixed
echogenicity mass in the 12 o'clock location of the LEFT breast 9
centimeters from the nipple measuring 1.4 x 0.8 x 0.5 centimeters.
Similar hyperechoic masses are identified throughout the UPPER
portions of the LEFT breast, spanning the 10 o'clock through the 2
o'clock locations.
IMPRESSION: Numerous small mixed echogenicity and hyperechoic masses are
consistent with fat necrosis.

RECOMMENDATION:
Recommend bilateral diagnostic mammogram and possible bilateral
breast ultrasound in 3 months to assess for interval change of the
mammographic findings.

I have discussed the findings and recommendations with the patient.
If applicable, a reminder letter will be sent to the patient
regarding the next appointment.

BI-RADS CATEGORY  3: Probably benign.

## 2023-03-11 ENCOUNTER — Encounter (HOSPITAL_COMMUNITY): Payer: Self-pay | Admitting: Internal Medicine

## 2023-03-12 ENCOUNTER — Other Ambulatory Visit (HOSPITAL_COMMUNITY): Payer: Self-pay | Admitting: Internal Medicine

## 2023-03-12 DIAGNOSIS — N63 Unspecified lump in unspecified breast: Secondary | ICD-10-CM

## 2023-03-18 ENCOUNTER — Ambulatory Visit (HOSPITAL_COMMUNITY)
Admission: RE | Admit: 2023-03-18 | Discharge: 2023-03-18 | Disposition: A | Discharge status: Home / Self Care | Payer: Medicare Other | Source: Ambulatory Visit | Attending: Internal Medicine | Admitting: Internal Medicine

## 2023-03-18 ENCOUNTER — Encounter (HOSPITAL_COMMUNITY): Payer: Self-pay

## 2023-03-18 DIAGNOSIS — N63 Unspecified lump in unspecified breast: Secondary | ICD-10-CM

## 2023-03-18 DIAGNOSIS — N6310 Unspecified lump in the right breast, unspecified quadrant: Secondary | ICD-10-CM | POA: Diagnosis present

## 2023-03-18 DIAGNOSIS — N641 Fat necrosis of breast: Secondary | ICD-10-CM | POA: Diagnosis not present

## 2023-03-18 DIAGNOSIS — N6320 Unspecified lump in the left breast, unspecified quadrant: Secondary | ICD-10-CM | POA: Diagnosis present

## 2023-05-23 DIAGNOSIS — J019 Acute sinusitis, unspecified: Secondary | ICD-10-CM | POA: Diagnosis not present

## 2023-05-23 DIAGNOSIS — R0981 Nasal congestion: Secondary | ICD-10-CM | POA: Diagnosis not present

## 2023-05-23 DIAGNOSIS — Z299 Encounter for prophylactic measures, unspecified: Secondary | ICD-10-CM | POA: Diagnosis not present

## 2023-06-05 ENCOUNTER — Other Ambulatory Visit (HOSPITAL_COMMUNITY): Payer: Self-pay

## 2023-06-05 MED ORDER — CANDESARTAN CILEXETIL 8 MG PO TABS
8.0000 mg | ORAL_TABLET | Freq: Every day | ORAL | 3 refills | Status: DC
  Filled 2023-06-05: qty 90, 90d supply, fill #0
  Filled 2023-09-05: qty 90, 90d supply, fill #1

## 2023-06-06 ENCOUNTER — Other Ambulatory Visit: Payer: Self-pay

## 2023-06-06 ENCOUNTER — Other Ambulatory Visit (HOSPITAL_COMMUNITY): Payer: Self-pay

## 2023-06-09 ENCOUNTER — Other Ambulatory Visit (HOSPITAL_COMMUNITY): Payer: Self-pay

## 2023-06-09 ENCOUNTER — Other Ambulatory Visit: Payer: Self-pay

## 2023-06-09 MED ORDER — CANDESARTAN CILEXETIL 8 MG PO TABS
8.0000 mg | ORAL_TABLET | Freq: Every day | ORAL | 3 refills | Status: AC
  Filled 2023-06-09 – 2023-12-01 (×2): qty 90, 90d supply, fill #0
  Filled 2024-02-29: qty 90, 90d supply, fill #1
  Filled 2024-05-29: qty 90, 90d supply, fill #2

## 2023-06-09 MED ORDER — METOPROLOL SUCCINATE ER 25 MG PO TB24
25.0000 mg | ORAL_TABLET | Freq: Two times a day (BID) | ORAL | 4 refills | Status: DC
  Filled 2023-06-09: qty 180, 90d supply, fill #0
  Filled 2023-09-05: qty 180, 90d supply, fill #1
  Filled 2023-12-01: qty 180, 90d supply, fill #2
  Filled 2024-02-29: qty 180, 90d supply, fill #3

## 2023-06-10 ENCOUNTER — Other Ambulatory Visit: Payer: Self-pay

## 2023-06-10 ENCOUNTER — Other Ambulatory Visit (HOSPITAL_COMMUNITY): Payer: Self-pay

## 2023-06-10 DIAGNOSIS — Z299 Encounter for prophylactic measures, unspecified: Secondary | ICD-10-CM | POA: Diagnosis not present

## 2023-06-10 DIAGNOSIS — E261 Secondary hyperaldosteronism: Secondary | ICD-10-CM | POA: Diagnosis not present

## 2023-06-10 DIAGNOSIS — I4891 Unspecified atrial fibrillation: Secondary | ICD-10-CM | POA: Diagnosis not present

## 2023-06-10 DIAGNOSIS — I10 Essential (primary) hypertension: Secondary | ICD-10-CM | POA: Diagnosis not present

## 2023-06-10 DIAGNOSIS — D692 Other nonthrombocytopenic purpura: Secondary | ICD-10-CM | POA: Diagnosis not present

## 2023-06-10 DIAGNOSIS — I700 Atherosclerosis of aorta: Secondary | ICD-10-CM | POA: Diagnosis not present

## 2023-06-10 MED ORDER — METOPROLOL SUCCINATE ER 25 MG PO TB24
25.0000 mg | ORAL_TABLET | Freq: Two times a day (BID) | ORAL | 1 refills | Status: DC
  Filled 2023-06-10: qty 180, 90d supply, fill #0

## 2023-06-10 MED ORDER — ELIQUIS 5 MG PO TABS
5.0000 mg | ORAL_TABLET | Freq: Two times a day (BID) | ORAL | 1 refills | Status: DC
  Filled 2023-06-10 – 2023-07-23 (×3): qty 180, 90d supply, fill #0
  Filled 2023-10-14: qty 180, 90d supply, fill #1

## 2023-06-10 MED ORDER — SPIRONOLACTONE 25 MG PO TABS
25.0000 mg | ORAL_TABLET | Freq: Every day | ORAL | 1 refills | Status: DC
  Filled 2023-06-10: qty 90, 90d supply, fill #0
  Filled 2023-09-05: qty 90, 90d supply, fill #1

## 2023-06-12 ENCOUNTER — Other Ambulatory Visit: Payer: Self-pay

## 2023-06-13 ENCOUNTER — Other Ambulatory Visit: Payer: Self-pay

## 2023-07-15 ENCOUNTER — Other Ambulatory Visit (HOSPITAL_COMMUNITY): Payer: Self-pay

## 2023-07-15 ENCOUNTER — Other Ambulatory Visit: Payer: Self-pay

## 2023-07-15 DIAGNOSIS — M79671 Pain in right foot: Secondary | ICD-10-CM | POA: Diagnosis not present

## 2023-07-15 DIAGNOSIS — G5750 Tarsal tunnel syndrome, unspecified lower limb: Secondary | ICD-10-CM | POA: Diagnosis not present

## 2023-07-15 DIAGNOSIS — M1990 Unspecified osteoarthritis, unspecified site: Secondary | ICD-10-CM | POA: Diagnosis not present

## 2023-07-15 DIAGNOSIS — M25579 Pain in unspecified ankle and joints of unspecified foot: Secondary | ICD-10-CM | POA: Diagnosis not present

## 2023-07-23 ENCOUNTER — Other Ambulatory Visit: Payer: Self-pay

## 2023-07-23 ENCOUNTER — Other Ambulatory Visit (HOSPITAL_COMMUNITY): Payer: Self-pay

## 2023-08-05 DIAGNOSIS — G5750 Tarsal tunnel syndrome, unspecified lower limb: Secondary | ICD-10-CM | POA: Diagnosis not present

## 2023-08-05 DIAGNOSIS — M7731 Calcaneal spur, right foot: Secondary | ICD-10-CM | POA: Diagnosis not present

## 2023-08-05 DIAGNOSIS — M79671 Pain in right foot: Secondary | ICD-10-CM | POA: Diagnosis not present

## 2023-09-05 ENCOUNTER — Other Ambulatory Visit (HOSPITAL_COMMUNITY): Payer: Self-pay

## 2023-09-06 ENCOUNTER — Other Ambulatory Visit: Payer: Self-pay

## 2023-09-08 ENCOUNTER — Other Ambulatory Visit (HOSPITAL_COMMUNITY): Payer: Self-pay

## 2023-09-15 DIAGNOSIS — I480 Paroxysmal atrial fibrillation: Secondary | ICD-10-CM | POA: Diagnosis not present

## 2023-09-15 DIAGNOSIS — I25118 Atherosclerotic heart disease of native coronary artery with other forms of angina pectoris: Secondary | ICD-10-CM | POA: Diagnosis not present

## 2023-09-15 DIAGNOSIS — I252 Old myocardial infarction: Secondary | ICD-10-CM | POA: Diagnosis not present

## 2023-09-23 DIAGNOSIS — I10 Essential (primary) hypertension: Secondary | ICD-10-CM | POA: Diagnosis not present

## 2023-09-23 DIAGNOSIS — R0789 Other chest pain: Secondary | ICD-10-CM | POA: Diagnosis not present

## 2023-09-23 DIAGNOSIS — I252 Old myocardial infarction: Secondary | ICD-10-CM | POA: Diagnosis not present

## 2023-09-23 DIAGNOSIS — I25118 Atherosclerotic heart disease of native coronary artery with other forms of angina pectoris: Secondary | ICD-10-CM | POA: Diagnosis not present

## 2023-09-23 DIAGNOSIS — E785 Hyperlipidemia, unspecified: Secondary | ICD-10-CM | POA: Diagnosis not present

## 2023-09-25 DIAGNOSIS — Z1331 Encounter for screening for depression: Secondary | ICD-10-CM | POA: Diagnosis not present

## 2023-09-25 DIAGNOSIS — R5383 Other fatigue: Secondary | ICD-10-CM | POA: Diagnosis not present

## 2023-09-25 DIAGNOSIS — Z0000 Encounter for general adult medical examination without abnormal findings: Secondary | ICD-10-CM | POA: Diagnosis not present

## 2023-09-25 DIAGNOSIS — E7800 Pure hypercholesterolemia, unspecified: Secondary | ICD-10-CM | POA: Diagnosis not present

## 2023-09-25 DIAGNOSIS — M8580 Other specified disorders of bone density and structure, unspecified site: Secondary | ICD-10-CM | POA: Diagnosis not present

## 2023-09-25 DIAGNOSIS — Z7189 Other specified counseling: Secondary | ICD-10-CM | POA: Diagnosis not present

## 2023-09-25 DIAGNOSIS — Z79899 Other long term (current) drug therapy: Secondary | ICD-10-CM | POA: Diagnosis not present

## 2023-09-25 DIAGNOSIS — Z299 Encounter for prophylactic measures, unspecified: Secondary | ICD-10-CM | POA: Diagnosis not present

## 2023-09-25 DIAGNOSIS — I10 Essential (primary) hypertension: Secondary | ICD-10-CM | POA: Diagnosis not present

## 2023-09-25 DIAGNOSIS — Z1339 Encounter for screening examination for other mental health and behavioral disorders: Secondary | ICD-10-CM | POA: Diagnosis not present

## 2023-10-14 ENCOUNTER — Other Ambulatory Visit: Payer: Self-pay

## 2023-10-16 ENCOUNTER — Other Ambulatory Visit (HOSPITAL_COMMUNITY): Payer: Self-pay

## 2023-10-16 DIAGNOSIS — H04123 Dry eye syndrome of bilateral lacrimal glands: Secondary | ICD-10-CM | POA: Diagnosis not present

## 2023-11-05 DIAGNOSIS — I10 Essential (primary) hypertension: Secondary | ICD-10-CM | POA: Diagnosis not present

## 2023-11-05 DIAGNOSIS — Z6833 Body mass index (BMI) 33.0-33.9, adult: Secondary | ICD-10-CM | POA: Diagnosis not present

## 2023-11-05 DIAGNOSIS — Z0000 Encounter for general adult medical examination without abnormal findings: Secondary | ICD-10-CM | POA: Diagnosis not present

## 2023-11-05 DIAGNOSIS — Z299 Encounter for prophylactic measures, unspecified: Secondary | ICD-10-CM | POA: Diagnosis not present

## 2023-11-06 ENCOUNTER — Other Ambulatory Visit (HOSPITAL_COMMUNITY): Payer: Self-pay

## 2023-11-06 MED ORDER — ATORVASTATIN CALCIUM 80 MG PO TABS
80.0000 mg | ORAL_TABLET | Freq: Every day | ORAL | 3 refills | Status: AC
  Filled 2023-11-06: qty 90, 90d supply, fill #0
  Filled 2024-01-28: qty 90, 90d supply, fill #1
  Filled 2024-04-27: qty 90, 90d supply, fill #2

## 2023-12-01 ENCOUNTER — Other Ambulatory Visit: Payer: Self-pay

## 2023-12-01 ENCOUNTER — Other Ambulatory Visit (HOSPITAL_COMMUNITY): Payer: Self-pay

## 2023-12-02 ENCOUNTER — Other Ambulatory Visit (HOSPITAL_COMMUNITY): Payer: Self-pay

## 2023-12-02 MED ORDER — SPIRONOLACTONE 25 MG PO TABS
25.0000 mg | ORAL_TABLET | Freq: Every day | ORAL | 1 refills | Status: DC
  Filled 2023-12-02: qty 90, 90d supply, fill #0
  Filled 2024-03-01: qty 90, 90d supply, fill #1

## 2023-12-03 ENCOUNTER — Other Ambulatory Visit: Payer: Self-pay

## 2023-12-04 DIAGNOSIS — I25118 Atherosclerotic heart disease of native coronary artery with other forms of angina pectoris: Secondary | ICD-10-CM | POA: Diagnosis not present

## 2023-12-04 DIAGNOSIS — E782 Mixed hyperlipidemia: Secondary | ICD-10-CM | POA: Diagnosis not present

## 2023-12-04 DIAGNOSIS — I10 Essential (primary) hypertension: Secondary | ICD-10-CM | POA: Diagnosis not present

## 2023-12-04 DIAGNOSIS — I252 Old myocardial infarction: Secondary | ICD-10-CM | POA: Diagnosis not present

## 2023-12-04 DIAGNOSIS — I480 Paroxysmal atrial fibrillation: Secondary | ICD-10-CM | POA: Diagnosis not present

## 2023-12-05 DIAGNOSIS — E8940 Asymptomatic postprocedural ovarian failure: Secondary | ICD-10-CM | POA: Diagnosis not present

## 2024-01-12 ENCOUNTER — Other Ambulatory Visit (HOSPITAL_COMMUNITY): Payer: Self-pay

## 2024-01-13 ENCOUNTER — Other Ambulatory Visit: Payer: Self-pay

## 2024-01-13 ENCOUNTER — Other Ambulatory Visit (HOSPITAL_COMMUNITY): Payer: Self-pay

## 2024-01-13 MED ORDER — ELIQUIS 5 MG PO TABS
5.0000 mg | ORAL_TABLET | Freq: Two times a day (BID) | ORAL | 1 refills | Status: AC
  Filled 2024-01-13: qty 180, 90d supply, fill #0
  Filled 2024-03-31: qty 180, 90d supply, fill #1

## 2024-02-17 DIAGNOSIS — I2510 Atherosclerotic heart disease of native coronary artery without angina pectoris: Secondary | ICD-10-CM | POA: Diagnosis not present

## 2024-02-17 DIAGNOSIS — I10 Essential (primary) hypertension: Secondary | ICD-10-CM | POA: Diagnosis not present

## 2024-02-17 DIAGNOSIS — I480 Paroxysmal atrial fibrillation: Secondary | ICD-10-CM | POA: Diagnosis not present

## 2024-02-17 DIAGNOSIS — R600 Localized edema: Secondary | ICD-10-CM | POA: Diagnosis not present

## 2024-03-01 ENCOUNTER — Other Ambulatory Visit: Payer: Self-pay

## 2024-03-31 ENCOUNTER — Other Ambulatory Visit (HOSPITAL_COMMUNITY): Payer: Self-pay

## 2024-04-01 ENCOUNTER — Other Ambulatory Visit (HOSPITAL_COMMUNITY): Payer: Self-pay

## 2024-04-01 DIAGNOSIS — I10 Essential (primary) hypertension: Secondary | ICD-10-CM | POA: Diagnosis not present

## 2024-04-01 DIAGNOSIS — R350 Frequency of micturition: Secondary | ICD-10-CM | POA: Diagnosis not present

## 2024-04-01 DIAGNOSIS — E7800 Pure hypercholesterolemia, unspecified: Secondary | ICD-10-CM | POA: Diagnosis not present

## 2024-04-01 DIAGNOSIS — I4891 Unspecified atrial fibrillation: Secondary | ICD-10-CM | POA: Diagnosis not present

## 2024-04-01 DIAGNOSIS — R52 Pain, unspecified: Secondary | ICD-10-CM | POA: Diagnosis not present

## 2024-04-01 DIAGNOSIS — Z299 Encounter for prophylactic measures, unspecified: Secondary | ICD-10-CM | POA: Diagnosis not present

## 2024-04-01 DIAGNOSIS — N390 Urinary tract infection, site not specified: Secondary | ICD-10-CM | POA: Diagnosis not present

## 2024-04-09 DIAGNOSIS — E7800 Pure hypercholesterolemia, unspecified: Secondary | ICD-10-CM | POA: Diagnosis not present

## 2024-04-09 DIAGNOSIS — R350 Frequency of micturition: Secondary | ICD-10-CM | POA: Diagnosis not present

## 2024-04-09 DIAGNOSIS — M545 Low back pain, unspecified: Secondary | ICD-10-CM | POA: Diagnosis not present

## 2024-04-09 DIAGNOSIS — R52 Pain, unspecified: Secondary | ICD-10-CM | POA: Diagnosis not present

## 2024-04-09 DIAGNOSIS — Z299 Encounter for prophylactic measures, unspecified: Secondary | ICD-10-CM | POA: Diagnosis not present

## 2024-04-09 DIAGNOSIS — I10 Essential (primary) hypertension: Secondary | ICD-10-CM | POA: Diagnosis not present

## 2024-04-16 DIAGNOSIS — R1032 Left lower quadrant pain: Secondary | ICD-10-CM | POA: Diagnosis not present

## 2024-04-29 ENCOUNTER — Ambulatory Visit: Attending: Internal Medicine | Admitting: Internal Medicine

## 2024-04-29 ENCOUNTER — Encounter: Payer: Self-pay | Admitting: Internal Medicine

## 2024-04-29 VITALS — BP 106/56 | HR 74 | Ht 64.0 in | Wt 192.6 lb

## 2024-04-29 DIAGNOSIS — Z7901 Long term (current) use of anticoagulants: Secondary | ICD-10-CM | POA: Insufficient documentation

## 2024-04-29 DIAGNOSIS — Z01810 Encounter for preprocedural cardiovascular examination: Secondary | ICD-10-CM

## 2024-04-29 DIAGNOSIS — G4733 Obstructive sleep apnea (adult) (pediatric): Secondary | ICD-10-CM

## 2024-04-29 DIAGNOSIS — E785 Hyperlipidemia, unspecified: Secondary | ICD-10-CM | POA: Insufficient documentation

## 2024-04-29 DIAGNOSIS — I4819 Other persistent atrial fibrillation: Secondary | ICD-10-CM | POA: Insufficient documentation

## 2024-04-29 DIAGNOSIS — I2510 Atherosclerotic heart disease of native coronary artery without angina pectoris: Secondary | ICD-10-CM | POA: Insufficient documentation

## 2024-04-29 DIAGNOSIS — I10 Essential (primary) hypertension: Secondary | ICD-10-CM | POA: Diagnosis not present

## 2024-04-29 DIAGNOSIS — E7849 Other hyperlipidemia: Secondary | ICD-10-CM

## 2024-04-29 NOTE — Progress Notes (Signed)
 Cardiology Office Note  Date: 04/29/2024   ID: Lindsey Mitchell, DOB Nov 15, 1955, MRN [id number]  PCP:  Rosamond Leta KATHEE, MD  Cardiologist:  Rafael Salway P Gilbert Narain, MD Electrophysiologist:  None   History of Present Illness: Lindsey Mitchell is a 68 y.o. female known to have CAD manifested by NSTEMI in 2021 s/p 3 PCI, persistent A-fib, HTN is here to establish care.  Previously followed by [facility] cardiology.  Patient was diagnosed with new onset atrial fibrillation in 2023.  Event monitor at that time showed 12% A-fib burden.  She was managed with rate controlling agents and systemic AC.  No DCCV or ablation performed.  Last EKG with known sinus rhythm was in May 2025.  Recent EKG from October 2025 and today showed atrial fibrillation, rate controlled.  She reports having SOB, palpitations, fatigue especially with exertion.  No angina.  No syncope.  Past Medical History:  Diagnosis Date   Diverticulitis    Hypertension     Past Surgical History:  Procedure Laterality Date   ABDOMINAL HYSTERECTOMY     COLONOSCOPY  04/2014   Dr. Donnel: Mild diverticulosis descending colon and sigmoid colon 3 sessile polyps removed, one tubular adenoma.  Repeat colonoscopy in 3 to 5 years   COLONOSCOPY  2007   Dr. Harvey: diverticulosis   SMALL INTESTINE SURGERY     bowel obstruction    Current Outpatient Medications  Medication Sig Dispense Refill   apixaban  (ELIQUIS ) 5 MG TABS tablet Take 1 tablet (5 mg total) by mouth 2 (two) times daily. 180 tablet 1   aspirin EC 81 MG tablet Take 81 mg by mouth daily. Swallow whole.     atorvastatin  (LIPITOR) 80 MG tablet Take 1 tablet (80 mg total) by mouth daily. 90 tablet 3   candesartan  (ATACAND ) 8 MG tablet Take 1 tablet (8 mg total) by mouth daily. 90 tablet 3   cholecalciferol (VITAMIN D3) 25 MCG (1000 UNIT) tablet Take 2,000 Units by mouth daily.     furosemide (LASIX) 20 MG tablet Take 20 mg by mouth.     metoprolol  succinate (TOPROL -XL) 25 MG 24 hr  tablet Take 1 tablet (25 mg total) by mouth 2 (two) times daily. 180 tablet 4   nitroGLYCERIN (NITROSTAT) 0.4 MG SL tablet Place 0.4 mg under the tongue.     spironolactone  (ALDACTONE ) 25 MG tablet Take 1 tablet (25 mg total) by mouth daily. 90 tablet 1   No current facility-administered medications for this visit.   Allergies:  Penicillins, Relafen [nabumetone], and Losartan   Social History: The patient  reports that she has never smoked. She has never used smokeless tobacco. She reports current alcohol  use. She reports that she does not use drugs.   Family History: The patient's family history includes Heart attack in her brother and sister; Heart failure in her father and mother; Hypertension in her father and another family member; Stroke in her brother.   ROS:  Please see the history of present illness. Otherwise, complete review of systems is positive for none  All other systems are reviewed and negative.   Physical Exam: VS:  BP (!) 106/56   Pulse 74   Ht 5' 4 (1.626 m)   Wt 192 lb 9.6 oz (87.4 kg)   SpO2 98%   BMI 33.06 kg/m , BMI Body mass index is 33.06 kg/m.  Wt Readings from Last 3 Encounters:  04/29/24 192 lb 9.6 oz (87.4 kg)  11/19/21 189 lb 3.2 oz (85.8 kg)  07/30/21 193 lb (87.5 kg)    General: Patient appears comfortable at rest. HEENT: Conjunctiva and lids normal, oropharynx clear with moist mucosa. Neck: Supple, no elevated JVP or carotid bruits, no thyromegaly. Lungs: Clear to auscultation, nonlabored breathing at rest. Cardiac: Regular rate and rhythm, no S3 or significant systolic murmur, no pericardial rub. Abdomen: Soft, nontender, no hepatomegaly, bowel sounds present, no guarding or rebound. Extremities: No pitting edema, distal pulses 2+. Skin: Warm and dry. Musculoskeletal: No kyphosis. Neuropsychiatric: Alert and oriented x3, affect grossly appropriate.  Recent Labwork: No results found for requested labs within last 365 days.     Component  Value Date/Time   CHOL 223 (H) 12/04/2018 0851   TRIG 71 12/04/2018 0851   HDL 82 12/04/2018 0851   CHOLHDL 2.7 12/04/2018 0851   LDLCALC 127 (H) 12/04/2018 0851     Assessment and Plan:  Persistent atrial fibrillation - Diagnosed in 2023, initially paroxysmal but now persistent - Last EKG with known NSR was in May 2025 - EKG today showed A-fib, rate controlled - Symptomatic especially with exertion, has fatigue, SOB, palpitations - Not on any antiarrhythmics at this time. - She will benefit from DCCV.  Will schedule in 3 to 4 weeks.  Agreed to not miss any anticoagulation for 3 weeks prior to DCCV.  Hold metoprolol  on the day of the procedure. - Continue metoprolol  succinate 25 mg twice daily. - Continue Eliquis  5 mg twice daily.  Stop aspirin. - Echo in 2023 within normal limits. - She is diagnosed with OSA.  She is hesitant to start CPAP but probably will if she has to.  Will refer her to sleep medicine.  Informed consent for DCCV After careful review of history and examination, Risks, benefits and alternatives of direct current cardioversion reviewed including potential for post-cardioversion rhythms,profound bradycardia, skin erythema, arrhythmias. Benefits include restoration of sinus rhythm. Alternatives to treatment were discussed, questions were answered. Patient is willing to proceed.    OSA - Diagnosed with OSA on home sleep today.  Hesitant to start CPAP but stated she probably might after speaking with sleep medicine..  Will refer to sleep medicine.  CAD manifested by NSTEMI in 2021 s/p LAD PCI x 3 - Discontinue aspirin, on Eliquis . - Continue atorvastatin  80 mg nightly. - NM stress test in 2025 within normal limits.-   HLD, unknown values - Continue atorvastatin  80 mg nightly.  Goal LDL less than 70.  HTN, controlled - Continue current antihypertensives, p.o. Lasix 20 mg once daily, metoprolol  succinate 25 mg twice daily, spironolactone  25 mg once daily and  candesartan  8 mg once daily.  Follows with PCP.  45 minutes spent in reviewing prior medical records, reports, more than 3 labs, discussion and documentation     Medication Adjustments/Labs and Tests Ordered: Current medicines are reviewed at length with the patient today.  Concerns regarding medicines are outlined above.    Disposition:  Follow up 2 months  Signed Katilyn Miltenberger Priya Vandy Tsuchiya, MD, 04/29/2024 2:25 PM    [facility] [address] [place], [place], [place] [address]

## 2024-04-29 NOTE — H&P (View-Only) (Signed)
 Cardiology Office Note  Date: 04/29/2024   ID: Lindsey Mitchell, DOB Nov 15, 1955, MRN [id number]  PCP:  Rosamond Leta KATHEE, MD  Cardiologist:  Rafael Salway P Gilbert Narain, MD Electrophysiologist:  None   History of Present Illness: Lindsey Mitchell is a 68 y.o. female known to have CAD manifested by NSTEMI in 2021 s/p 3 PCI, persistent A-fib, HTN is here to establish care.  Previously followed by [facility] cardiology.  Patient was diagnosed with new onset atrial fibrillation in 2023.  Event monitor at that time showed 12% A-fib burden.  She was managed with rate controlling agents and systemic AC.  No DCCV or ablation performed.  Last EKG with known sinus rhythm was in May 2025.  Recent EKG from October 2025 and today showed atrial fibrillation, rate controlled.  She reports having SOB, palpitations, fatigue especially with exertion.  No angina.  No syncope.  Past Medical History:  Diagnosis Date   Diverticulitis    Hypertension     Past Surgical History:  Procedure Laterality Date   ABDOMINAL HYSTERECTOMY     COLONOSCOPY  04/2014   Dr. Donnel: Mild diverticulosis descending colon and sigmoid colon 3 sessile polyps removed, one tubular adenoma.  Repeat colonoscopy in 3 to 5 years   COLONOSCOPY  2007   Dr. Harvey: diverticulosis   SMALL INTESTINE SURGERY     bowel obstruction    Current Outpatient Medications  Medication Sig Dispense Refill   apixaban  (ELIQUIS ) 5 MG TABS tablet Take 1 tablet (5 mg total) by mouth 2 (two) times daily. 180 tablet 1   aspirin EC 81 MG tablet Take 81 mg by mouth daily. Swallow whole.     atorvastatin  (LIPITOR) 80 MG tablet Take 1 tablet (80 mg total) by mouth daily. 90 tablet 3   candesartan  (ATACAND ) 8 MG tablet Take 1 tablet (8 mg total) by mouth daily. 90 tablet 3   cholecalciferol (VITAMIN D3) 25 MCG (1000 UNIT) tablet Take 2,000 Units by mouth daily.     furosemide (LASIX) 20 MG tablet Take 20 mg by mouth.     metoprolol  succinate (TOPROL -XL) 25 MG 24 hr  tablet Take 1 tablet (25 mg total) by mouth 2 (two) times daily. 180 tablet 4   nitroGLYCERIN (NITROSTAT) 0.4 MG SL tablet Place 0.4 mg under the tongue.     spironolactone  (ALDACTONE ) 25 MG tablet Take 1 tablet (25 mg total) by mouth daily. 90 tablet 1   No current facility-administered medications for this visit.   Allergies:  Penicillins, Relafen [nabumetone], and Losartan   Social History: The patient  reports that she has never smoked. She has never used smokeless tobacco. She reports current alcohol  use. She reports that she does not use drugs.   Family History: The patient's family history includes Heart attack in her brother and sister; Heart failure in her father and mother; Hypertension in her father and another family member; Stroke in her brother.   ROS:  Please see the history of present illness. Otherwise, complete review of systems is positive for none  All other systems are reviewed and negative.   Physical Exam: VS:  BP (!) 106/56   Pulse 74   Ht 5' 4 (1.626 m)   Wt 192 lb 9.6 oz (87.4 kg)   SpO2 98%   BMI 33.06 kg/m , BMI Body mass index is 33.06 kg/m.  Wt Readings from Last 3 Encounters:  04/29/24 192 lb 9.6 oz (87.4 kg)  11/19/21 189 lb 3.2 oz (85.8 kg)  07/30/21 193 lb (87.5 kg)    General: Patient appears comfortable at rest. HEENT: Conjunctiva and lids normal, oropharynx clear with moist mucosa. Neck: Supple, no elevated JVP or carotid bruits, no thyromegaly. Lungs: Clear to auscultation, nonlabored breathing at rest. Cardiac: Regular rate and rhythm, no S3 or significant systolic murmur, no pericardial rub. Abdomen: Soft, nontender, no hepatomegaly, bowel sounds present, no guarding or rebound. Extremities: No pitting edema, distal pulses 2+. Skin: Warm and dry. Musculoskeletal: No kyphosis. Neuropsychiatric: Alert and oriented x3, affect grossly appropriate.  Recent Labwork: No results found for requested labs within last 365 days.     Component  Value Date/Time   CHOL 223 (H) 12/04/2018 0851   TRIG 71 12/04/2018 0851   HDL 82 12/04/2018 0851   CHOLHDL 2.7 12/04/2018 0851   LDLCALC 127 (H) 12/04/2018 0851     Assessment and Plan:  Persistent atrial fibrillation - Diagnosed in 2023, initially paroxysmal but now persistent - Last EKG with known NSR was in May 2025 - EKG today showed A-fib, rate controlled - Symptomatic especially with exertion, has fatigue, SOB, palpitations - Not on any antiarrhythmics at this time. - She will benefit from DCCV.  Will schedule in 3 to 4 weeks.  Agreed to not miss any anticoagulation for 3 weeks prior to DCCV.  Hold metoprolol  on the day of the procedure. - Continue metoprolol  succinate 25 mg twice daily. - Continue Eliquis  5 mg twice daily.  Stop aspirin. - Echo in 2023 within normal limits. - She is diagnosed with OSA.  She is hesitant to start CPAP but probably will if she has to.  Will refer her to sleep medicine.  Informed consent for DCCV After careful review of history and examination, Risks, benefits and alternatives of direct current cardioversion reviewed including potential for post-cardioversion rhythms,profound bradycardia, skin erythema, arrhythmias. Benefits include restoration of sinus rhythm. Alternatives to treatment were discussed, questions were answered. Patient is willing to proceed.    OSA - Diagnosed with OSA on home sleep today.  Hesitant to start CPAP but stated she probably might after speaking with sleep medicine..  Will refer to sleep medicine.  CAD manifested by NSTEMI in 2021 s/p LAD PCI x 3 - Discontinue aspirin, on Eliquis . - Continue atorvastatin  80 mg nightly. - NM stress test in 2025 within normal limits.-   HLD, unknown values - Continue atorvastatin  80 mg nightly.  Goal LDL less than 70.  HTN, controlled - Continue current antihypertensives, p.o. Lasix 20 mg once daily, metoprolol  succinate 25 mg twice daily, spironolactone  25 mg once daily and  candesartan  8 mg once daily.  Follows with PCP.  45 minutes spent in reviewing prior medical records, reports, more than 3 labs, discussion and documentation     Medication Adjustments/Labs and Tests Ordered: Current medicines are reviewed at length with the patient today.  Concerns regarding medicines are outlined above.    Disposition:  Follow up 2 months  Signed Katilyn Miltenberger Priya Vandy Tsuchiya, MD, 04/29/2024 2:25 PM    [facility] [address] [place], [place], [place] [address]

## 2024-04-29 NOTE — Patient Instructions (Signed)
 Medication Instructions:  Your physician recommends that you continue on your current medications as directed. Please refer to the Current Medication list given to you today.   Labwork: CBC and BMET to be completed prior Cardioversion the week before  Can be completed at [facility] or [facility]  Testing/Procedures: Your physician has recommended that you have a Cardioversion (DCCV). Electrical Cardioversion uses a jolt of electricity to your heart either through paddles or wired patches attached to your chest. This is a controlled, usually prescheduled, procedure. Defibrillation is done under light anesthesia in the hospital, and you usually go home the day of the procedure. This is done to get your heart back into a normal rhythm. You are not awake for the procedure. Please see the instruction sheet given to you today.   Follow-Up: Your physician recommends that you schedule a follow-up appointment in:   Any Other Special Instructions Will Be Listed Below (If Applicable). Referral to Sleep Medicine   Thank you for choosing [facility]!     If you need a refill on your cardiac medications before your next appointment, please call your pharmacy.

## 2024-05-21 ENCOUNTER — Other Ambulatory Visit (HOSPITAL_COMMUNITY): Payer: Self-pay | Admitting: Internal Medicine

## 2024-05-21 DIAGNOSIS — Z1231 Encounter for screening mammogram for malignant neoplasm of breast: Secondary | ICD-10-CM

## 2024-05-25 ENCOUNTER — Encounter: Payer: Self-pay | Admitting: Internal Medicine

## 2024-05-26 ENCOUNTER — Ambulatory Visit (HOSPITAL_COMMUNITY)
Admission: RE | Admit: 2024-05-26 | Discharge: 2024-05-26 | Disposition: A | Discharge status: Home / Self Care | Source: Ambulatory Visit | Attending: Internal Medicine | Admitting: Internal Medicine

## 2024-05-26 ENCOUNTER — Encounter (HOSPITAL_COMMUNITY)
Admission: RE | Admit: 2024-05-26 | Discharge: 2024-05-26 | Disposition: A | Discharge status: Home / Self Care | Source: Ambulatory Visit | Attending: Internal Medicine | Admitting: Internal Medicine

## 2024-05-26 ENCOUNTER — Encounter (HOSPITAL_COMMUNITY): Payer: Self-pay

## 2024-05-26 DIAGNOSIS — Z1231 Encounter for screening mammogram for malignant neoplasm of breast: Secondary | ICD-10-CM | POA: Insufficient documentation

## 2024-05-27 NOTE — Pre-Procedure Instructions (Signed)
Attempted pre-op phone call. Left VM for her to call back. 

## 2024-05-28 ENCOUNTER — Encounter (HOSPITAL_COMMUNITY): Admission: RE | Disposition: A | Payer: Self-pay | Source: Home / Self Care | Attending: Internal Medicine

## 2024-05-28 ENCOUNTER — Encounter (HOSPITAL_COMMUNITY): Payer: Self-pay | Admitting: Internal Medicine

## 2024-05-28 ENCOUNTER — Ambulatory Visit (HOSPITAL_COMMUNITY): Payer: Self-pay | Admitting: Certified Registered Nurse Anesthetist

## 2024-05-28 ENCOUNTER — Ambulatory Visit (HOSPITAL_COMMUNITY)
Admission: RE | Admit: 2024-05-28 | Discharge: 2024-05-28 | Disposition: A | Discharge status: Home / Self Care | Attending: Internal Medicine | Admitting: Internal Medicine

## 2024-05-28 ENCOUNTER — Encounter (HOSPITAL_COMMUNITY): Payer: Self-pay | Admitting: Certified Registered Nurse Anesthetist

## 2024-05-28 ENCOUNTER — Other Ambulatory Visit: Payer: Self-pay

## 2024-05-28 DIAGNOSIS — I10 Essential (primary) hypertension: Secondary | ICD-10-CM | POA: Insufficient documentation

## 2024-05-28 DIAGNOSIS — I2510 Atherosclerotic heart disease of native coronary artery without angina pectoris: Secondary | ICD-10-CM | POA: Insufficient documentation

## 2024-05-28 DIAGNOSIS — G4733 Obstructive sleep apnea (adult) (pediatric): Secondary | ICD-10-CM | POA: Diagnosis not present

## 2024-05-28 DIAGNOSIS — I4891 Unspecified atrial fibrillation: Secondary | ICD-10-CM

## 2024-05-28 DIAGNOSIS — I4819 Other persistent atrial fibrillation: Secondary | ICD-10-CM | POA: Diagnosis present

## 2024-05-28 DIAGNOSIS — Z955 Presence of coronary angioplasty implant and graft: Secondary | ICD-10-CM | POA: Diagnosis not present

## 2024-05-28 HISTORY — DX: Other specified postprocedural states: R11.2

## 2024-05-28 HISTORY — PX: CARDIOVERSION: SHX1299

## 2024-05-28 MED ORDER — METOPROLOL SUCCINATE ER 25 MG PO TB24: 25.0000 mg | ORAL_TABLET | Freq: Every day | ORAL | 4 refills | Status: AC

## 2024-05-28 MED ORDER — PROPOFOL 10 MG/ML IV BOLUS
INTRAVENOUS | Status: DC | PRN
  Administered 2024-05-28 (×2): 50 mg via INTRAVENOUS

## 2024-05-28 MED ORDER — LACTATED RINGERS IV SOLN: INTRAVENOUS | Status: DC | PRN

## 2024-05-28 MED ORDER — LIDOCAINE 2% (20 MG/ML) 5 ML SYRINGE
INTRAMUSCULAR | Status: DC | PRN
  Administered 2024-05-28: 100 mg via INTRAVENOUS

## 2024-05-28 MED ORDER — SODIUM CHLORIDE 0.9 % IV SOLN: INTRAVENOUS | Status: DC

## 2024-05-28 MED ORDER — LACTATED RINGERS IV SOLN: INTRAVENOUS | Status: DC

## 2024-05-28 MED ORDER — PROPOFOL 500 MG/50ML IV EMUL
INTRAVENOUS | Status: DC | PRN
  Administered 2024-05-28: 150 ug/kg/min via INTRAVENOUS

## 2024-05-28 NOTE — Progress Notes (Signed)
 Electrical Cardioversion Procedure Note Lindsey Mitchell [id number] 26-Jul-1955  Procedure: Electrical Cardioversion Indications:  Atrial Fibrillation  Procedure Details Consent: Risks of procedure as well as the alternatives and risks of each were explained to the (patient/caregiver).  Consent for procedure obtained. Time Out: Verified patient identification, verified procedure, site/side was marked, verified correct patient position, special equipment/implants available, medications/allergies/relevent history reviewed, required imaging and test results available.  Performed @ 1309  Patient placed on cardiac monitor, pulse oximetry, supplemental oxygen  as necessary.  Sedation given: Propofol  Pacer pads placed anterior and posterior chest.  Cardioverted 1 time(s).  Cardioverted at 150J.  Evaluation Findings: Post procedure EKG shows: NSR Complications: None Patient did tolerate procedure well.   Lindsey Mitchell 05/28/2024, 1:13 PM

## 2024-05-28 NOTE — Transfer of Care (Signed)
 Immediate Anesthesia Transfer of Care Note  Patient: Lindsey Mitchell  Procedure(s) Performed: CARDIOVERSION  Patient Location: Short Stay  Anesthesia Type:MAC  Level of Consciousness: awake, oriented, and patient cooperative  Airway & Oxygen  Therapy: Patient Spontanous Breathing and Patient connected to nasal cannula oxygen   Post-op Assessment: Report given to RN and Post -op Vital signs reviewed and stable  Post vital signs: Reviewed and stable  Last Vitals:  Vitals Value Taken Time  BP 127/62 05/28/24 13:22  Temp    Pulse 72 05/28/24 13:22  Resp 15 05/28/24 13:22  SpO2 100% 05/28/24 13:22    Last Pain:  Vitals:   05/28/24 1256  TempSrc:   PainSc: 0-No pain      Patients Stated Pain Goal: 5 (05/28/24 1139)  Complications: No notable events documented.

## 2024-05-28 NOTE — Anesthesia Postprocedure Evaluation (Signed)
"   Anesthesia Post Note  Patient: Lindsey Mitchell  Procedure(s) Performed: CARDIOVERSION  Patient location during evaluation: Phase II Anesthesia Type: MAC Level of consciousness: awake Pain management: pain level controlled Vital Signs Assessment: post-procedure vital signs reviewed and stable Respiratory status: spontaneous breathing and respiratory function stable Cardiovascular status: blood pressure returned to baseline and stable Postop Assessment: no headache and no apparent nausea or vomiting Anesthetic complications: no Comments: Late entry   No notable events documented.   Last Vitals:  Vitals:   05/28/24 1336 05/28/24 1350  BP: (!) 162/74 (!) 158/76  Pulse: 72 67  Resp: 12 13  Temp:    SpO2: 100% 99%    Last Pain:  Vitals:   05/28/24 1350  TempSrc:   PainSc: 0-No pain                 Yvonna PARAS Lindsey Mitchell      "

## 2024-05-28 NOTE — CV Procedure (Addendum)
" ° °  DIRECT CURRENT CARDIOVERSION  NAME:  Lindsey Mitchell    MRN: [id number] DOB:  09/04/1955    ADMIT DATE: 05/28/2024  INDICATIONS: Symptomatic atrial fibrillation  PROCEDURE:  Informed consent was obtained prior to the procedure. Anesthesia was monitored by Dr. Kendell. The patient had the defibrillator pads placed in the anterior and posterior position. Once an appropriate level of sedation was confirmed, the patient was cardioverted x 1 with 150J of biphasic synchronized energy.  The patient converted to NSR. There were no apparent complications.  The patient had normal neuro status and respiratory status post procedure with vitals stable as recorded elsewhere.  Adequate airway was maintained throughout and vital signs monitored per protocol.  RECOMMENDATIONS: Post cardioversion EKG showed normal sinus rhythm with HR 70 bpm. Tele showed HR low 60s. Resume metoprolol  succinate 25 mg once daily (change from twice daily home med) and Eliquis  5 mg twice daily.  Keep appointment with me on 08/05/2024.   Adlean Hardeman Priya Ankit Degregorio, MD [place]  [facility]  1:21 PM     "

## 2024-05-28 NOTE — Interval H&P Note (Signed)
 History and Physical Interval Note:  05/28/2024 1:04 PM  Lindsey Mitchell  has presented today for surgery, with the diagnosis of a-fib.  The various methods of treatment have been discussed with the patient and family. After consideration of risks, benefits and other options for treatment, the patient has consented to  Procedures: CARDIOVERSION (N/A) as a surgical intervention.  The patient's history has been reviewed, patient examined, no change in status, stable for surgery.  I have reviewed the patient's chart and labs.  Questions were answered to the patient's satisfaction.     Lindsey Mitchell P Zayleigh Stroh, MD [facility]

## 2024-05-28 NOTE — Anesthesia Preprocedure Evaluation (Signed)
"                                    Anesthesia Evaluation  Patient identified by MRN, date of birth, ID band Patient awake    Reviewed: Allergy & Precautions, H&P , NPO status , Patient's Chart, lab work & pertinent test results, reviewed documented beta blocker date and time   History of Anesthesia Complications (+) PONV and history of anesthetic complications  Airway Mallampati: II  TM Distance: >3 FB Neck ROM: full    Dental no notable dental hx.    Pulmonary sleep apnea    Pulmonary exam normal breath sounds clear to auscultation       Cardiovascular Exercise Tolerance: Good hypertension, + CAD  + dysrhythmias Atrial Fibrillation  Rhythm:irregular Rate:Normal     Neuro/Psych negative neurological ROS  negative psych ROS   GI/Hepatic negative GI ROS, Neg liver ROS,,,  Endo/Other  negative endocrine ROS    Renal/GU negative Renal ROS  negative genitourinary   Musculoskeletal   Abdominal   Peds  Hematology negative hematology ROS (+)   Anesthesia Other Findings   Reproductive/Obstetrics negative OB ROS                              Anesthesia Physical Anesthesia Plan  ASA: 3  Anesthesia Plan: MAC   Post-op Pain Management:    Induction:   PONV Risk Score and Plan: Propofol  infusion  Airway Management Planned:   Additional Equipment:   Intra-op Plan:   Post-operative Plan:   Informed Consent: I have reviewed the patients History and Physical, chart, labs and discussed the procedure including the risks, benefits and alternatives for the proposed anesthesia with the patient or authorized representative who has indicated his/her understanding and acceptance.     Dental Advisory Given  Plan Discussed with: CRNA  Anesthesia Plan Comments:         Anesthesia Quick Evaluation  "

## 2024-05-29 ENCOUNTER — Other Ambulatory Visit (HOSPITAL_COMMUNITY): Payer: Self-pay

## 2024-05-31 ENCOUNTER — Other Ambulatory Visit: Payer: Self-pay

## 2024-05-31 NOTE — Progress Notes (Unsigned)
 "    Sleep Medicine Note    Date:  06/01/2024   ID:  ABBEY VEITH, DOB December 10, 1955, MRN [id number]  PCP:  Rosamond Leta KATHEE, MD  Cardiologist: Vishnu P Mallipeddi, MD   Chief Complaint  Patient presents with   New Patient (Initial Visit)    OSA    History of Present Illness:  Lindsey Mitchell is a 69 y.o. female who is being seen today for the evaluation of OSA at the request of Diannah Gentle, MD.  This is a a 69yo female with a hx of HTN, ASCAD, persistent atrial fibrillation, diverticulitis who had a home sleep study done in 2023 showing mild OSA with an AHI of 10.4/hr.  She was placed on CPAP by Dr. Shellia with Pulmonary and followed by him but has not been seen since 2023 when he relocated.  She is now referred for sleep medicine consultation to establish sleep care.    She tells me that she stopped using it shortly after Dr. Shellia moved.  She says that she could not get a good seal on her mask.  She tried the FFM and nasal pillow mask. She does not feel rested in the am.  She gets sleepy in the later afternoon and has to sit down and nap. She denies any am HA.  She goes to bed at 9:30-10:30PM and get up at [facility].  She wakes up 3-4 times nightly to go the the bathroom. Her husband says that she snores but has not noticed her stop breathing in her sleep.  Past Medical History:  Diagnosis Date   Diverticulitis    Hypertension    OSA (obstructive sleep apnea)    PONV (postoperative nausea and vomiting)     Past Surgical History:  Procedure Laterality Date   ABDOMINAL HYSTERECTOMY     COLONOSCOPY  04/2014   Dr. Donnel: Mild diverticulosis descending colon and sigmoid colon 3 sessile polyps removed, one tubular adenoma.  Repeat colonoscopy in 3 to 5 years   COLONOSCOPY  2007   Dr. Harvey: diverticulosis   SMALL INTESTINE SURGERY     bowel obstruction    Current Medications: Active Medications[1]  Allergies:   Penicillins, Relafen [nabumetone], and Losartan   Social History    Socioeconomic History   Marital status: Married    Spouse name: Not on file   Number of children: Not on file   Years of education: Not on file   Highest education level: Not on file  Occupational History   Not on file  Tobacco Use   Smoking status: Never   Smokeless tobacco: Never  Vaping Use   Vaping status: Never Used  Substance and Sexual Activity   Alcohol  use: Yes    Comment: socially   Drug use: No   Sexual activity: Not on file  Other Topics Concern   Not on file  Social History Narrative   Not on file   Social Drivers of Health   Tobacco Use: Low Risk (06/01/2024)   Patient History    Smoking Tobacco Use: Never    Smokeless Tobacco Use: Never    Passive Exposure: Not on file  Financial Resource Strain: Low Risk (11/08/2021)   Received from [facility]   Overall Financial Resource Strain (CARDIA)    Difficulty of Paying Living Expenses: Not hard at all  Food Insecurity: No Food Insecurity (11/08/2021)   Received from [facility]   Epic    Within the past 12 months, you  worried that your food would run out before you got the money to buy more.: Never true    Within the past 12 months, the food you bought just didn't last and you didn't have money to get more.: Never true  Transportation Needs: No Transportation Needs (11/08/2021)   Received from [facility] - Transportation    Lack of Transportation (Medical): No    Lack of Transportation (Non-Medical): No  Physical Activity: Sufficiently Active (11/08/2021)   Received from [facility]   Exercise Vital Sign    On average, how many days per week do you engage in moderate to strenuous exercise (like a brisk walk)?: 5 days    On average, how many minutes do you engage in exercise at this level?: 120 min  Stress: No Stress Concern Present (11/07/2021)   Received from [facility] - Occupational  Stress Questionnaire    Feeling of Stress : Only a little  Social Connections: Not on file  Depression (EYV7-0): Not on file  Alcohol  Screen: Not on file  Housing: Not on file  Utilities: Not on file  Health Literacy: Low Risk (05/16/2022)   Received from [facility]   Health Literacy    : Never     Family History:  The patient's family history includes Heart attack in her brother and sister; Heart failure in her father and mother; Hypertension in her father and another family member; Stroke in her brother.   ROS:   Please see the history of present illness.    ROS All other systems reviewed and are negative.     04/14/2019    1:37 PM  PAD Screen  Previous PAD dx? No  Previous surgical procedure? No  Pain with walking? No  Feet/toe relief with dangling? No  Painful, non-healing ulcers? No  Extremities discolored? No       PHYSICAL EXAM:   VS:  BP 134/80   Pulse 81   Ht 5' 4 (1.626 m)   Wt 191 lb (86.6 kg)   SpO2 98%   BMI 32.79 kg/m    GEN: Well nourished, well developed, in no acute distress  HEENT: normal  Neck: no JVD, carotid bruits, or masses Cardiac: irregularly irregular; no murmurs, rubs, or gallops,no edema.  Intact distal pulses bilaterally.  Occasional ectopy Respiratory:  clear to auscultation bilaterally, normal work of breathing GI: soft, nontender, nondistended, + BS MS: no deformity or atrophy  Skin: warm and dry, no rash Neuro:  Alert and Oriented x 3, Strength and sensation are intact Psych: euthymic mood, full affect  Wt Readings from Last 3 Encounters:  06/01/24 191 lb (86.6 kg)  05/28/24 187 lb (84.8 kg)  04/29/24 192 lb 9.6 oz (87.4 kg)      Studies/Labs Reviewed:    Recent Labs: No results found for requested labs within last 365 days.     ASSESSMENT:    1. OSA (obstructive sleep apnea)   2. Primary hypertension      PLAN:  In order of problems listed above:  OSA  -she has a hx of mild OSA with AHI 10.4/hr and  tried CPAP for a while but has not used it recently -she tried an oral mandibular advancement device but did not tolerate it -will check an [facility] HST to see if she has any OSA currently  HTN -BP controlled on exam today -continue Candesartan  8mg  daily, Toprol  SL 25mg  daily and  spiro 25mg  daily with PRN refills -I have personally reviewed and interpreted outside labs performed by patient's PCP which showed SCr 0.81 and K+ 4.6 on 05/24/2024  PAF -had DCCV last Friday and unfortunately is back in afib rate controlled today -will let Dr. Stacia know   Time Spent: 20 minutes total time of encounter, including 15 minutes spent in face-to-face patient care on the date of this encounter. This time includes coordination of care and counseling regarding above mentioned problem list. Remainder of non-face-to-face time involved reviewing chart documents/testing relevant to the patient encounter and documentation in the medical record. I have independently reviewed documentation from referring provider  Medication Adjustments/Labs and Tests Ordered: Current medicines are reviewed at length with the patient today.  Concerns regarding medicines are outlined above.  Medication changes, Labs and Tests ordered today are listed in the Patient Instructions below.  There are no Patient Instructions on file for this visit.   Signed, Wilbert Bihari, MD  06/01/2024 11:16 AM    [facility] [address] [place], [place], [place]  [address] Phone: [phone]; Fax: [phone]      [1]  Current Meds  Medication Sig   apixaban  (ELIQUIS ) 5 MG TABS tablet Take 1 tablet (5 mg total) by mouth 2 (two) times daily.   atorvastatin  (LIPITOR) 80 MG tablet Take 1 tablet (80 mg total) by mouth daily.   candesartan  (ATACAND ) 8 MG tablet Take 1 tablet (8 mg total) by mouth daily.   cholecalciferol (VITAMIN D3) 25 MCG (1000 UNIT) tablet Take 2,000 Units by mouth daily.   furosemide (LASIX) 20 MG  tablet Take 20 mg by mouth.   metoprolol  succinate (TOPROL -XL) 25 MG 24 hr tablet Take 1 tablet (25 mg total) by mouth daily.   nitroGLYCERIN (NITROSTAT) 0.4 MG SL tablet Place 0.4 mg under the tongue.   spironolactone  (ALDACTONE ) 25 MG tablet Take 1 tablet (25 mg total) by mouth daily.   "

## 2024-06-01 ENCOUNTER — Encounter: Payer: Self-pay | Admitting: Cardiology

## 2024-06-01 ENCOUNTER — Other Ambulatory Visit: Payer: Self-pay

## 2024-06-01 ENCOUNTER — Other Ambulatory Visit (HOSPITAL_COMMUNITY): Payer: Self-pay

## 2024-06-01 ENCOUNTER — Ambulatory Visit: Attending: Cardiology | Admitting: Cardiology

## 2024-06-01 VITALS — BP 134/80 | HR 81 | Ht 64.0 in | Wt 191.0 lb

## 2024-06-01 DIAGNOSIS — G4733 Obstructive sleep apnea (adult) (pediatric): Secondary | ICD-10-CM | POA: Insufficient documentation

## 2024-06-01 DIAGNOSIS — I10 Essential (primary) hypertension: Secondary | ICD-10-CM | POA: Diagnosis not present

## 2024-06-01 DIAGNOSIS — I4891 Unspecified atrial fibrillation: Secondary | ICD-10-CM

## 2024-06-01 MED ORDER — SPIRONOLACTONE 25 MG PO TABS
25.0000 mg | ORAL_TABLET | Freq: Every day | ORAL | 1 refills | Status: AC
  Filled 2024-06-01: qty 90, 90d supply, fill #0

## 2024-06-01 NOTE — Addendum Note (Signed)
 Addended by: JANIT GENI CROME on: 06/01/2024 11:36 AM   Modules accepted: Orders

## 2024-06-01 NOTE — Addendum Note (Signed)
 Addended by: JANIT GENI CROME on: 06/01/2024 11:33 AM   Modules accepted: Orders

## 2024-06-01 NOTE — Patient Instructions (Signed)
 Medication Instructions:  Your physician recommends that you continue on your current medications as directed. Please refer to the Current Medication list given to you today.  *If you need a refill on your cardiac medications before your next appointment, please call your pharmacy*  Lab Work: None.  If you have labs (blood work) drawn today and your tests are completely normal, you will receive your results only by: MyChart Message (if you have MyChart) OR A paper copy in the mail If you have any lab test that is abnormal or we need to change your treatment, we will call you to review the results.  Testing/Procedures: WatchPAT? is an FDA-cleared portable home sleep study test that uses a watch and 3 points of contact to monitor 7 different channels, including your heart rate, oxygen  saturation, body position, snoring, and chest motion.  The study is easy to use from the comfort of your own home and accurately detect sleep apnea.  Before bed, you attach the chest sensor, attached the sleep apnea bracelet to your nondominant hand, and attach the finger probe.  After the study, the raw data is downloaded from the watch and scored for apnea events.   For more information: [email or web address]  Patient Testing Instructions:  Once our office has received insurance approval for you to complete this test, we will contact you with a PIN to activate the device.  This typically takes 2-3 weeks.  Please do not open the box until approved.   Do not put battery into the device until bedtime when you are ready to begin the test. Please call the support number if you need assistance after following the instructions below: 24 hour support line- [phone] or [facility] support at [phone] (option 2)  Download the [facility] WatchPAT One app through the [employer] or [employer]. Be sure to turn on or enable access to Bluetooth in settings on your smartphone. Make sure no other  Bluetooth devices are on and within the vicinity of your smartphone and WatchPAT watch during testing.  Make sure to leave your smart phone plugged in and charging all night.  When ready for bed:  Follow the instructions step by step in the WatchPAT One app to activate the testing device. For additional instructions, including video instruction, visit the WatchPAT One video on Youtube. You can search for WatchPAT One within Youtube (video is 4 minutes and 18 seconds) or enter: https://youtube/watch?[email or web address]_vbiwxE Please note: You will be prompted to enter a PIN to connect via Bluetooth when starting the test. The PIN will be assigned to you after insurance has approved the test.  The device is disposable, but it recommended that you retain the device until you receive a call letting you know the study has been received and the results have been interpreted.  We will let you know if the study did not transmit to us  properly after the test is completed. You do not need to call us  to confirm the receipt of the test.  Please complete the test within 48 hours of receiving PIN.   Frequently Asked Questions:  What is Watch PAT One?  A single use, fully disposable home sleep apnea testing device and will not need to be returned after completion.  What are the requirements to use WatchPAT One?  A successful WatchPAT One sleep study requires a WatchPAT One device, your smart phone, WatchPAT One app, your PIN number, and internet access. What type of phone do I need?  You should have a  smart phone that uses Android 5.1 and above or any iPhone with IOS 10 and above. How can I download the WatchPAT one app?  Based on your device type search for WatchPAT One app either in [employer] for [employer] or [employer] for [employer]. Where will I get my PIN for the study?  Your PIN will be provided by your physician's office after insurance has approved the test. This process typically takes 2-3 weeks. It  is used for authentication and if you lose/forget your PIN, please reach out to your provider's office.  I do not have internet at home. Can I still complete a WatchPAT One study?  WatchPAT One needs internet connection throughout the night to be able to transmit the sleep data. You can use your home/local internet or your cellular data package. However, it is always recommended to use home/local internet. It is estimated that between 20MB-30MB of data will be used with each study, but the application will be looking for space in the phone to start the study.  What happens if I lose internet or Bluetooth connection?  During the internet disconnection, your phone will not be able to transmit the sleep data.  All the data, will be stored in your phone.  As soon as the internet connection is back on, the phone will resume sending the sleep data. During the Bluetooth disconnection, WatchPAT One will not be able to to send the sleep data to your phone.  Data will be kept in the WatchPAT One until both devices have Bluetooth connection back on.  As soon as the connection is back on, WatchPAT one will send the sleep data to the phone.  How long do I need to wear the WatchPAT one?  After you start the study, you should wear the device at least 6 hours.  How far should I keep my phone from the device?  During the night, your phone should remain within 15 feet of where you sleep.  What happens if I leave the room for restroom or other reasons?  Leaving the room for any reason will not cause any problem. As soon as your get back to the room, both devices will reconnect and will continue to send the sleep data. Can I use my phone during the sleep study?  Yes, you can use your phone as usual during the study. But it is recommended to put your WatchPAT One on when you are ready to go to bed.  How will I get my study results?  A soon as you completed your study, your sleep data will be sent to the provider.  They will then share the results with you when they are ready.    Follow-Up: At [facility], you and your health needs are our priority.  As part of our continuing mission to provide you with exceptional heart care, our providers are all part of one team.  This team includes your primary Cardiologist (physician) and Advanced Practice Providers or APPs (Physician Assistants and Nurse Practitioners) who all work together to provide you with the care you need, when you need it.  Your next appointment will be dependent on the results of your sleep study and will be with:     Provider:   Dr. Wilbert Bihari, MD

## 2024-06-03 ENCOUNTER — Telehealth: Payer: Self-pay | Admitting: Internal Medicine

## 2024-06-03 NOTE — Telephone Encounter (Signed)
 Pt had Cardioversion done on 1/16 and now has a bad cough. Pt asking what kind of cough syrup is she okay to take. Please advise.

## 2024-06-04 NOTE — Telephone Encounter (Signed)
 Recommend a [place] HBP cough syrup

## 2024-06-08 NOTE — Telephone Encounter (Signed)
 Patient notified and verbalized understanding. Patient had no further questions or concerns at this time.

## 2024-06-17 ENCOUNTER — Telehealth: Payer: Self-pay

## 2024-06-17 DIAGNOSIS — I4891 Unspecified atrial fibrillation: Secondary | ICD-10-CM

## 2024-06-17 NOTE — Telephone Encounter (Signed)
-----   Message from Vishnu Mallipeddi, MD sent at 06/17/2024  1:25 PM EST ----- Thank you Traci.  Dijon Cosens, can you place referral to EP for Afib ablation. Offer telephone visit to the patient to discuss why EP referral had to be made. APP/MD is fine. ----- Message ----- From: Shlomo Wilbert SAUNDERS, MD Sent: 06/01/2024  11:33 AM EST To: Vishnu P Mallipeddi, MD  Saw her today for sleep and she is back in afib after DCCV last Friday

## 2024-06-17 NOTE — Telephone Encounter (Signed)
 Called patient to advised her that Dr. Mallipeddi has recommended a referral for Electrophysiology and that someone will call and set up an appointment soon. Patient verbalized understanding.

## 2024-07-16 ENCOUNTER — Ambulatory Visit: Admitting: Nurse Practitioner

## 2024-08-05 ENCOUNTER — Ambulatory Visit: Admitting: Internal Medicine
